# Patient Record
Sex: Female | Born: 1970 | Race: White | Hispanic: No | Marital: Single | Smoking: Never smoker
Health system: Southern US, Community
[De-identification: ages and names within clinical notes are randomized; demographics above are authoritative.]

## PROBLEM LIST (undated history)

## (undated) DIAGNOSIS — G40909 Epilepsy, unspecified, not intractable, without status epilepticus: Secondary | ICD-10-CM

## (undated) DIAGNOSIS — R011 Cardiac murmur, unspecified: Secondary | ICD-10-CM

## (undated) HISTORY — PX: CYSTECTOMY: SUR359

---

## 2004-03-31 ENCOUNTER — Emergency Department (HOSPITAL_COMMUNITY): Admission: EM | Admit: 2004-03-31 | Discharge: 2004-03-31 | Payer: Self-pay | Admitting: Emergency Medicine

## 2012-09-14 ENCOUNTER — Other Ambulatory Visit: Payer: Self-pay | Admitting: Obstetrics and Gynecology

## 2012-09-14 DIAGNOSIS — R928 Other abnormal and inconclusive findings on diagnostic imaging of breast: Secondary | ICD-10-CM

## 2012-09-20 ENCOUNTER — Other Ambulatory Visit: Payer: Self-pay

## 2012-10-16 ENCOUNTER — Ambulatory Visit
Admission: RE | Admit: 2012-10-16 | Discharge: 2012-10-16 | Disposition: A | Discharge status: Home / Self Care | Payer: No Typology Code available for payment source | Source: Ambulatory Visit | Attending: Obstetrics and Gynecology | Admitting: Obstetrics and Gynecology

## 2012-10-16 DIAGNOSIS — R928 Other abnormal and inconclusive findings on diagnostic imaging of breast: Secondary | ICD-10-CM

## 2013-07-20 ENCOUNTER — Emergency Department (HOSPITAL_COMMUNITY)
Admission: EM | Admit: 2013-07-20 | Discharge: 2013-07-20 | Disposition: A | Discharge status: Home / Self Care | Payer: Self-pay | Attending: Emergency Medicine | Admitting: Emergency Medicine

## 2013-07-20 ENCOUNTER — Encounter (HOSPITAL_COMMUNITY): Payer: Self-pay | Admitting: Emergency Medicine

## 2013-07-20 DIAGNOSIS — R011 Cardiac murmur, unspecified: Secondary | ICD-10-CM | POA: Insufficient documentation

## 2013-07-20 DIAGNOSIS — Z8669 Personal history of other diseases of the nervous system and sense organs: Secondary | ICD-10-CM | POA: Insufficient documentation

## 2013-07-20 DIAGNOSIS — R110 Nausea: Secondary | ICD-10-CM | POA: Insufficient documentation

## 2013-07-20 DIAGNOSIS — R21 Rash and other nonspecific skin eruption: Secondary | ICD-10-CM | POA: Insufficient documentation

## 2013-07-20 DIAGNOSIS — Z880 Allergy status to penicillin: Secondary | ICD-10-CM | POA: Insufficient documentation

## 2013-07-20 DIAGNOSIS — Z79899 Other long term (current) drug therapy: Secondary | ICD-10-CM | POA: Insufficient documentation

## 2013-07-20 DIAGNOSIS — R259 Unspecified abnormal involuntary movements: Secondary | ICD-10-CM

## 2013-07-20 HISTORY — DX: Cardiac murmur, unspecified: R01.1

## 2013-07-20 HISTORY — DX: Epilepsy, unspecified, not intractable, without status epilepticus: G40.909

## 2013-07-20 LAB — COMPREHENSIVE METABOLIC PANEL
ALK PHOS: 51 U/L (ref 39–117)
ALT: 27 U/L (ref 0–35)
AST: 22 U/L (ref 0–37)
Albumin: 3.4 g/dL — ABNORMAL LOW (ref 3.5–5.2)
BUN: 27 mg/dL — ABNORMAL HIGH (ref 6–23)
CO2: 20 mEq/L (ref 19–32)
Calcium: 9.4 mg/dL (ref 8.4–10.5)
Chloride: 107 mEq/L (ref 96–112)
Creatinine, Ser: 0.66 mg/dL (ref 0.50–1.10)
GFR calc Af Amer: >90 mL/min (ref >90)
GFR calc non Af Amer: >90 mL/min (ref >90)
GLUCOSE: 107 mg/dL — AB (ref 70–99)
POTASSIUM: 4.1 meq/L (ref 3.7–5.3)
Sodium: 142 mEq/L (ref 137–147)
Total Protein: 7 g/dL (ref 6.0–8.3)

## 2013-07-20 LAB — CBC WITH DIFFERENTIAL/PLATELET
Basophils Absolute: 0 10*3/uL (ref 0.0–0.1)
Basophils Relative: 0 % (ref 0–1)
EOS ABS: 0.1 10*3/uL (ref 0.0–0.7)
Eosinophils Relative: 1 % (ref 0–5)
HEMATOCRIT: 37.7 % (ref 36.0–46.0)
HEMOGLOBIN: 12.7 g/dL (ref 12.0–15.0)
LYMPHS ABS: 2.3 10*3/uL (ref 0.7–4.0)
Lymphocytes Relative: 34 % (ref 12–46)
MCH: 27.9 pg (ref 26.0–34.0)
MCHC: 33.7 g/dL (ref 30.0–36.0)
MCV: 82.9 fL (ref 78.0–100.0)
MONO ABS: 0.7 10*3/uL (ref 0.1–1.0)
MONOS PCT: 11 % (ref 3–12)
NEUTROS PCT: 54 % (ref 43–77)
Neutro Abs: 3.7 10*3/uL (ref 1.7–7.7)
Platelets: 249 10*3/uL (ref 150–400)
RBC: 4.55 MIL/uL (ref 3.87–5.11)
RDW: 12.6 % (ref 11.5–15.5)
WBC: 6.8 10*3/uL (ref 4.0–10.5)

## 2013-07-20 LAB — URINALYSIS, ROUTINE W REFLEX MICROSCOPIC
Bilirubin Urine: NEGATIVE
Glucose, UA: NEGATIVE mg/dL
Hgb urine dipstick: NEGATIVE
Ketones, ur: NEGATIVE mg/dL
LEUKOCYTES UA: NEGATIVE
Nitrite: NEGATIVE
PROTEIN: NEGATIVE mg/dL
Specific Gravity, Urine: 1.021 (ref 1.005–1.030)
UROBILINOGEN UA: 0.2 mg/dL (ref 0.0–1.0)
pH: 5.5 (ref 5.0–8.0)

## 2013-07-20 MED ORDER — LORAZEPAM 0.5 MG PO TABS: 0.5000 mg | ORAL_TABLET | Freq: Two times a day (BID) | ORAL | Status: DC

## 2013-07-20 MED ORDER — LORAZEPAM 1 MG PO TABS
1.0000 mg | ORAL_TABLET | Freq: Once | ORAL | Status: AC
  Administered 2013-07-20: 0.5 mg via ORAL
  Filled 2013-07-20: qty 1

## 2013-07-20 NOTE — Consult Note (Addendum)
Neurology Consultation Reason for Consult: Shaking Referring Physician: Nonda Louocherty, M  CC: Shaking  History is obtained from: Patient, mother  HPI: Janice Mccoy is a 43 y.o. female with a history of childhood epilepsy who presents with 3 days of truncal shaking. She states that it has been progressively worse over the past 3 days. She has not noticed anything that makes it better, but has noticed that stress or being upset makes it worse. It happens every few seconds to a few minutes and consists of bilateral truncal agitation. She has never had anything like this before.  She does note that she had a migraine 3 days ago, but the headache is completely gone. She denies any underlying stressors that are doing worse.   ROS: A 14 point ROS was performed and is negative except as noted in the HPI.  Past Medical History  Diagnosis Date  . Epilepsy     childhood  . Heart murmur     Family History: No history of movement disorders  Social History: Tob: Denies  Exam: Current vital signs: BP 134/68  Pulse 99  Temp(Src) 97.8 F (36.6 C) (Oral)  Resp 19  SpO2 95%  LMP 07/14/2013 Vital signs in last 24 hours: Temp:  [97.8 F (36.6 C)] 97.8 F (36.6 C) (04/03 1720) Pulse Rate:  [99] 99 (04/03 1720) Resp:  [19] 19 (04/03 1720) BP: (134)/(68) 134/68 mmHg (04/03 1720) SpO2:  [95 %] 95 % (04/03 1720)  General: In bed, NAD CV: Regular rate and rhythm Mental Status: Patient is awake, alert, oriented to person, place, month, year, and situation. Immediate and remote memory are intact. Patient is able to give a clear and coherent history. No signs of aphasia or neglect Cranial Nerves: II: Visual Fields are full. Pupils are equal, round, and reactive to light.  Discs are difficult to visualize. III,IV, VI: EOMI without ptosis or diploplia.  V: Facial sensation is symmetric to temperature VII: Facial movement is symmetric.  VIII: hearing is intact to voice X: Uvula elevates  symmetrically XI: Shoulder shrug is symmetric. XII: tongue is midline without atrophy or fasciculations.  Motor: Tone is normal. Bulk is normal. 5/5 strength was present in all four extremities.  Sensory: Sensation is symmetric to light touch and temperature in the arms and legs. Deep Tendon Reflexes: Checking her deep tendon reflexes repeatedly precipitates her abnormal movements, and sometimes she will take her leg prior to the reflex hammer actually touching her tendon. Plantars: Toes are downgoing bilaterally.  Cerebellar: She has no ataxia, and indeed the movements are completely distractible when she is performing finger-nose-finger. Gait: Patient has a stable casual gait. Able to tandem. She has bilateral shoulder "wiggling" the entire time that she is walking.    I have reviewed labs in epic and the results pertinent to this consultation are: CMP, CBC-unremarkable  Impression: 43 year old female with abnormal movements of the shoulders and trunk sometimes including the legs. The nature of these movements, and the bilateral nature are not consistent with seizures. The distractible nature is highly suggestive of a psychogenic etiology. At this time, I would not favor any further workup.   Recommendations: 1) No further inpatient workup at this time 2) could consider anxiolytic for a few days. 3) If symptoms are not improving or patient would like second opinion, could be referred to outpatient neurology.   Ritta SlotMcNeill Romel Dumond, MD [facility] [phone]  If 7pm- 7am, please page neurology on call as listed in [facility].

## 2013-07-20 NOTE — ED Notes (Addendum)
Pt states that since Wednesday she has had intermittent bouts of full body shaking. Pt remains awake during these episodes. States she has a hx of childhood epilepsy and her last peti-mal seizure was when she was 43yo. Also experiencing occasional chest pain. Alert and oriented at this time.

## 2013-07-20 NOTE — Discharge Instructions (Signed)
Tremor  Tremor is a rhythmic, involuntary muscular contraction characterized by oscillations (to-and-fro movements) of a part of the body. The most common of all involuntary movements, tremor can affect various body parts such as the hands, head, facial structures, vocal cords, trunk, and legs; most tremors, however, occur in the hands. Tremor often accompanies neurological disorders associated with aging. Although the disorder is not life-threatening, it can be responsible for functional disability and social embarrassment.  TREATMENT   There are many types of tremor and several ways in which tremor is classified. The most common classification is by behavioral context or position. There are five categories of tremor within this classification: resting, postural, kinetic, task-specific, and psychogenic. Resting or static tremor occurs when the muscle is at rest, for example when the hands are lying on the lap. This type of tremor is often seen in patients with Parkinson's disease. Postural tremor occurs when a patient attempts to maintain posture, such as holding the hands outstretched. Postural tremors include physiological tremor, essential tremor, tremor with basal ganglia disease (also seen in patients with Parkinson's disease), cerebellar postural tremor, tremor with peripheral neuropathy, post-traumatic tremor, and alcoholic tremor. Kinetic or intention (action) tremor occurs during purposeful movement, for example during finger-to-nose testing. Task-specific tremor appears when performing goal-oriented tasks such as handwriting, speaking, or standing. This group consists of primary writing tremor, vocal tremor, and orthostatic tremor. Psychogenic tremor occurs in both older and younger patients. The key feature of this tremor is that it dramatically lessens or disappears when the patient is distracted.  PROGNOSIS  There are some treatment options available for tremor; the appropriate treatment depends on  accurate diagnosis of the cause. Some tremors respond to treatment of the underlying condition, for example in some cases of psychogenic tremor treating the patient's underlying mental problem may cause the tremor to disappear. Also, patients with tremor due to Parkinson's disease may be treated with Levodopa drug therapy. Symptomatic drug therapy is available for several other tremors as well. For those cases of tremor in which there is no effective drug treatment, physical measures such as teaching the patient to brace the affected limb during the tremor are sometimes useful. Surgical intervention such as thalamotomy or deep brain stimulation may be useful in certain cases.  Document Released: 03/26/2002 Document Revised: 06/28/2011 Document Reviewed: 04/05/2005  ExitCare® Patient Information ©2014 [employer].

## 2013-07-20 NOTE — Progress Notes (Signed)
   CARE MANAGEMENT ED NOTE 07/20/2013  Patient:  Janice Mccoy,Janice Mccoy   Account Number:  [id number]  Date Initiated:  07/20/2013  Documentation initiated by:  Radford PaxFERRERO,Jariah Tarkowski  Subjective/Objective Assessment:   Patient presents to Ed with bouts of full body shaking     Subjective/Objective Assessment Detail:   Patient with pmhx of childhood epilepsy.     Action/Plan:   Action/Plan Detail:   Anticipated DC Date:       Status Recommendation to Physician:   Result of Recommendation:    Other ED Services  Consult Working Plan    DC Planning Services  Other  PCP issues    Choice offered to / List presented to:            Status of service:  Completed, signed off  ED Comments:   ED Comments Detail:  EDCM spoke to patient at bedside.  Patient confirms her pcp [facility]Wilson [name]  system updated.  Patient reports she does not have insurance.  As per patient, she has been denied by Medicaid.  [facility] provided [facility] with list of discounted pharmacies and websites needymeds.org and goodrx.com for medication assistance, list of financial resources in the community such as local churches and [employer]. Patient reports she is not taking any medications at this time except for one and [facility].  [facility].  Patient thankful for resources.  No further EDCM needs at this time.

## 2013-07-20 NOTE — ED Provider Notes (Signed)
CSN: [id number]     Arrival date & time 07/20/13  1653 History   First MD Initiated Contact with Patient 07/20/13 1721     Chief Complaint  Patient presents with  . Shaking     (Consider location/radiation/quality/duration/timing/severity/associated sxs/prior Treatment) Patient is a 43 y.o. female presenting with neurologic complaint. The history is provided by the patient. No language interpreter was used.  Neurologic Problem This is a new problem. The current episode started in the past 7 days. The problem occurs intermittently. The problem has been waxing and waning. Associated symptoms include nausea. Pertinent negatives include no fever, headaches, urinary symptoms, visual change or weakness.    Past Medical History  Diagnosis Date  . Epilepsy     childhood  . Heart murmur    No past surgical history on file. No family history on file. History  Substance Use Topics  . Smoking status: Never Smoker   . Smokeless tobacco: Not on file  . Alcohol Use: No   OB History   Grav Para Term Preterm Abortions TAB SAB Ect Mult Living                 Review of Systems  Constitutional: Negative for fever.  Gastrointestinal: Positive for nausea.  Neurological: Positive for tremors. Negative for facial asymmetry, speech difficulty, weakness and headaches.  All other systems reviewed and are negative.      Allergies  Amoxicillin  Home Medications   Current Outpatient Rx  Name  Route  Sig  Dispense  Refill  . Multiple Vitamin (MULTIVITAMIN WITH MINERALS) TABS tablet   Oral   Take 1 tablet by mouth daily.         Marland Kitchen omega-3 acid ethyl esters (LOVAZA) 1 G capsule   Oral   Take 1 g by mouth daily.         . propranolol (INDERAL) 80 MG tablet   Oral   Take 40 mg by mouth 2 (two) times daily.         . vitamin C (ASCORBIC ACID) 500 MG tablet   Oral   Take 500 mg by mouth daily.          BP 134/68  Pulse 99  Temp(Src) 97.8 F (36.6 C) (Oral)  Resp 19  SpO2  95%  LMP 07/14/2013 Physical Exam  Nursing note and vitals reviewed. Constitutional: She is oriented to person, place, and time. She appears well-developed and well-nourished.  HENT:  Head: Normocephalic and atraumatic.  Eyes: Conjunctivae are normal. Pupils are equal, round, and reactive to light.  Neck: Normal range of motion.  Cardiovascular: Normal rate and regular rhythm.   Pulmonary/Chest: Effort normal and breath sounds normal.  Abdominal: Soft. Bowel sounds are normal.  Musculoskeletal: Normal range of motion. She exhibits no edema and no tenderness.  Lymphadenopathy:    She has no cervical adenopathy.  Neurological: She is alert and oriented to person, place, and time. She has normal strength. No cranial nerve deficit or sensory deficit. Coordination normal. GCS eye subscore is 4. GCS verbal subscore is 5. GCS motor subscore is 6.  Skin: Skin is warm and dry. Rash noted. There is erythema.  Erythematous dry skin rash on face.  Psychiatric: She has a normal mood and affect. Her behavior is normal. Judgment and thought content normal.    ED Course  Procedures (including critical care time) Labs Review Labs Reviewed  CBC WITH DIFFERENTIAL  COMPREHENSIVE METABOLIC PANEL  URINALYSIS, ROUTINE W REFLEX MICROSCOPIC   Imaging  Review No results found.   EKG Interpretation   Date/Time:  Friday July 20 2013 17:27:15 EDT Ventricular Rate:  103 PR Interval:  153 QRS Duration: 76 QT Interval:  328 QTC Calculation: 429 R Axis:   71 Text Interpretation:  Sinus tachycardia Probable left atrial enlargement  Confirmed by WARD,  DO, KRISTEN ([id number]) on 07/20/2013 5:29:16 PM     Patient seen by neurology.  Feels origin is psychogenic.  Suggests short term anxiolytic for symptom control.  Information for out-patient neurology follow-up provided. MDM   Final diagnoses:  None    Tremor, likely psychogenic.    Jimmye Normanavid John Itza Maniaci, NP 07/20/13 2154

## 2013-07-21 NOTE — ED Provider Notes (Signed)
Medical screening examination/treatment/procedure(s) were performed by non-physician practitioner and as supervising physician I was immediately available for consultation/collaboration.   Shanna CiscoMegan E Docherty, MD 07/21/13 1038

## 2013-08-29 ENCOUNTER — Encounter: Payer: Self-pay | Admitting: *Deleted

## 2013-08-30 ENCOUNTER — Ambulatory Visit (INDEPENDENT_AMBULATORY_CARE_PROVIDER_SITE_OTHER): Payer: Self-pay | Admitting: Neurology

## 2013-08-30 ENCOUNTER — Encounter: Payer: Self-pay | Admitting: Neurology

## 2013-08-30 ENCOUNTER — Encounter (INDEPENDENT_AMBULATORY_CARE_PROVIDER_SITE_OTHER): Payer: Self-pay

## 2013-08-30 VITALS — BP 112/67 | HR 90 | Ht 63.0 in | Wt 185.0 lb

## 2013-08-30 DIAGNOSIS — F411 Generalized anxiety disorder: Secondary | ICD-10-CM

## 2013-08-30 DIAGNOSIS — R259 Unspecified abnormal involuntary movements: Secondary | ICD-10-CM

## 2013-08-30 DIAGNOSIS — F419 Anxiety disorder, unspecified: Secondary | ICD-10-CM

## 2013-08-30 MED ORDER — CLONAZEPAM 0.5 MG PO TABS: 0.5000 mg | ORAL_TABLET | Freq: Two times a day (BID) | ORAL | Status: DC | PRN

## 2013-08-30 NOTE — Patient Instructions (Signed)
Overall you are doing fairly well but I do want to suggest a few things today:   Remember to drink plenty of fluid, eat healthy meals and do not skip any meals. Try to eat protein with a every meal and eat a healthy snack such as fruit or nuts in between meals. Try to keep a regular sleep-wake schedule and try to exercise daily, particularly in the form of walking, 20-30 minutes a day, if you can.   As far as your medications are concerned, I would like to suggest the following: 1)Please stop the ativan 2)Start taking Klonopin 0.5mg  twice a day  Please have some blood work checked today  Please consider working with a psychiatrist to address possible anxiety or depression  I would like to see you back in 1 month, sooner if we need to. Please call us with any interim questions, concerns, problems, updates or refill requests.   Please also call us for any test results so we can go over those with you on the phone.  My clinical assistant and will answer any of your questions and relay your messages to me and also relay most of my messages to you.   Our phone number is [phone]. We also have an after hours call service for urgent matters and there is a physician on-call for urgent questions. For any emergencies you know to call 911 or go to the nearest emergency room

## 2013-08-30 NOTE — Progress Notes (Signed)
[facility]    Provider:  Dr Hosie PoissonSumner Referring Provider: Kaleen MaskElkins, Wilson Mccoy, * Primary Care Physician:  Janice MaskELKINS,[name], MD  CC:  Abnormal involuntary movements  HPI:  Janice Mccoy is a 43 y.o. female here as a referral from Dr. Jeannetta Mccoy for abnormal involuntary movements  Started around 1-2 months ago, initially noted symptoms in her legs, started in both legs at the same time and then spread up to involve the rest of her body. Came on acutely, states she woke up with the symptoms. Describes symptoms as being constant but will fluctuate throughout the day. Is almost resolved by the time she goes to bed. It gets worse with increased stress/anxiety. The initial symptoms were "bouncing up and down" and then went to "swaying from side to side", it will vary throughout the day. Continues to have symptoms with walking. Symptoms resolve when she is sleeping. Was evaluated by the ED, was also evaluated by a neurologist in the ED, told her it was related to anxiety, was given ativan and discharged.   Had history of childhood epilepsy described as partial seizure, last seizure episode was around 1714-43 years of age.   Around the time of symptom onset there were no new medications, no recent illnesses, no fevers.   Review of Systems: Out of a complete 14 system review, the patient complains of only the following symptoms, and all other reviewed systems are negative. + weakness, tremor, decreased energy, aching muscles, increased thirst, feeling hot  History   Social History  . Marital Status: Single    Spouse Name: N/A    Number of Children: N/A  . Years of Education: N/A   Occupational History  . Not on file.   Social History Main Topics  . Smoking status: Never Smoker   . Smokeless tobacco: Never Used  . Alcohol Use: No  . Drug Use: No  . Sexual Activity: Not on file   Other Topics Concern  . Not on file   Social History Narrative   Single, no  children   Right handed    12 th grade   1/2-1 cup daily    No family history on file.  Past Medical History  Diagnosis Date  . Epilepsy     childhood  . Heart murmur     No past surgical history on file.  Current Outpatient Prescriptions  Medication Sig Dispense Refill  . LORazepam (ATIVAN) 0.5 MG tablet Take 1 tablet (0.5 mg total) by mouth 2 (two) times daily.  8 tablet  0  . minocycline (DYNACIN) 100 MG tablet Take 100 mg by mouth 2 (two) times daily.      . Multiple Vitamin (MULTIVITAMIN WITH MINERALS) TABS tablet Take 1 tablet by mouth daily.      Marland Kitchen. omega-3 acid ethyl esters (LOVAZA) 1 G capsule Take 1 g by mouth daily.      . propranolol (INDERAL) 80 MG tablet Take 40 mg by mouth 2 (two) times daily.      . vitamin C (ASCORBIC ACID) 500 MG tablet Take 500 mg by mouth daily.       No current facility-administered medications for this visit.    Allergies as of 08/30/2013 - Review Complete 08/30/2013  Allergen Reaction Noted  . Amoxicillin Rash 07/20/2013    Vitals: BP 112/67  Pulse 90  Ht 5\' 3"  (1.6 m)  Wt 185 lb (83.915 kg)  BMI 32.78 kg/m2 Last Weight:  Wt Readings from Last 1 Encounters:  08/30/13  185 lb (83.915 kg)   Last Height:   Ht Readings from Last 1 Encounters:  08/30/13 5\' 3"  (1.6 m)     Physical exam: Exam: Gen: NAD, conversant Eyes: anicteric sclerae, moist conjunctivae HENT: Atraumatic, oropharynx clear Neck: Trachea midline; supple,  Lungs: CTA, no wheezing, rales, rhonic                          CV: RRR, no MRG Abdomen: Soft, non-tender;  Extremities: No peripheral edema  Skin: Normal temperature, no rash,  Psych: Appropriate affect, pleasant  Neuro: MS: AA&Ox3, appropriately interactive, normal affect   Speech: fluent w/o paraphasic error  Memory: good recent and remote recall  CN: PERRL, EOMI no nystagmus, no ptosis, sensation intact to LT V1-V3 bilat, face symmetric, no weakness, hearing grossly intact, palate elevates  symmetrically, shoulder shrug 5/5 bilat,  tongue protrudes midline, no fasiculations noted.  Motor: normal bulk and tone Strength: 5/5  In all extremities Near continuous movements during the exam, would alternate between a "bouncing" up and down movement and a swaying side to side movements. Decreased with distraction and was not present when I initially walked into the room  Reflexes: symmetrical, bilat downgoing toes  Sens: LT intact in all extremities  Gait: slightly wide based, bobbing motion while walking, able to tandem walk with exaggerated instability, always able to self correct when coming close to falling   Assessment:  After physical and neurologic examination, review of laboratory studies, imaging, neurophysiology testing and pre-existing records, assessment will be reviewed on the problem list.  Plan:  Treatment plan and additional workup will be reviewed under Problem List.  1)Abnormal involuntary movements 2)Anxiety  43y/o woman presenting for initial evaluation of acute onset of abnormal involuntary movements. 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 Will check TSH, ammonia, ceruloplasmin but have low suspicion for an organic cause of her movements. Counseled patient on likely diagnosis and explained how stress/anxiety can manifest in multiple different ways. Counseled her that she would benefit from working with a psychiatrist, she wishes to think about it at this time. Will d/c ativan and switch to klonopin 0.5mg  BID for treatment of anxiety and movements.   Elspeth ChoPeter Leonetta Mcgivern, DO  [facility] [address] [place], [place] [id number]  Phone [phone] Fax [phone]

## 2013-08-31 ENCOUNTER — Telehealth: Payer: Self-pay | Admitting: *Deleted

## 2013-08-31 ENCOUNTER — Other Ambulatory Visit: Payer: Self-pay | Admitting: Neurology

## 2013-08-31 DIAGNOSIS — E0590 Thyrotoxicosis, unspecified without thyrotoxic crisis or storm: Secondary | ICD-10-CM

## 2013-08-31 LAB — TSH: TSH: 0.005 u[IU]/mL — ABNORMAL LOW (ref 0.450–4.500)

## 2013-08-31 LAB — CERULOPLASMIN: Ceruloplasmin: 31.5 mg/dL (ref 16.0–45.0)

## 2013-08-31 LAB — AMMONIA: Ammonia: 47 ug/dL (ref 19–87)

## 2013-08-31 NOTE — Telephone Encounter (Signed)
Patient calling back to inform me that she will be in on Monday before 4 pm to have her labs drawn.

## 2013-09-03 ENCOUNTER — Other Ambulatory Visit (INDEPENDENT_AMBULATORY_CARE_PROVIDER_SITE_OTHER): Payer: Self-pay

## 2013-09-03 DIAGNOSIS — Z0289 Encounter for other administrative examinations: Secondary | ICD-10-CM

## 2013-09-03 DIAGNOSIS — E0590 Thyrotoxicosis, unspecified without thyrotoxic crisis or storm: Secondary | ICD-10-CM

## 2013-09-04 ENCOUNTER — Telehealth: Payer: Self-pay | Admitting: *Deleted

## 2013-09-04 LAB — THYROID PEROXIDASE ANTIBODY: Thyroid Peroxidase Ab: 244 IU/mL — ABNORMAL HIGH (ref 0–34)

## 2013-09-04 LAB — T4, FREE: Free T4: 4.44 ng/dL — ABNORMAL HIGH (ref 0.82–1.77)

## 2013-09-04 LAB — T3, FREE: T3, Free: 13.5 pg/mL — ABNORMAL HIGH (ref 2.0–4.4)

## 2013-09-04 NOTE — Progress Notes (Signed)
Quick Note:  Patient returned my call, she is scheduled for 09/14/13 at 8 am. ______

## 2013-09-04 NOTE — Telephone Encounter (Deleted)
Called patient to schedule appointment per Dr. Sumner, left message to return the call. 

## 2013-09-04 NOTE — Telephone Encounter (Signed)
Patient returning call.

## 2013-09-04 NOTE — Telephone Encounter (Signed)
Called patient to schedule appointment per Dr. Hosie PoissonSumner, left message to return the call.

## 2013-09-04 NOTE — Telephone Encounter (Signed)
Spoke with patient, appointment is scheduled for 09/14/13 at 8 am.

## 2013-09-14 ENCOUNTER — Encounter: Payer: Self-pay | Admitting: Neurology

## 2013-09-14 ENCOUNTER — Ambulatory Visit (INDEPENDENT_AMBULATORY_CARE_PROVIDER_SITE_OTHER): Payer: Self-pay | Admitting: Neurology

## 2013-09-14 VITALS — BP 125/73 | HR 91 | Ht 60.5 in | Wt 186.0 lb

## 2013-09-14 DIAGNOSIS — Z8639 Personal history of other endocrine, nutritional and metabolic disease: Secondary | ICD-10-CM | POA: Insufficient documentation

## 2013-09-14 DIAGNOSIS — E0590 Thyrotoxicosis, unspecified without thyrotoxic crisis or storm: Secondary | ICD-10-CM

## 2013-09-14 DIAGNOSIS — R259 Unspecified abnormal involuntary movements: Secondary | ICD-10-CM

## 2013-09-14 NOTE — Progress Notes (Signed)
[facility]    Provider:  Dr Hosie Poisson Referring Provider: [name], * Primary Care Physician:  [name], MD  CC:  Abnormal involuntary movements  HPI:  Janice Mccoy is a 43 y.o. female here as a follow up from Dr. [name] for abnormal involuntary movements. At last visit she had blood work completed which showed findings consistent with hyper-thyroidism. She was also started on klonopin 0.5mg  BID. Notes the movements have improved, will occur intermittently throughout the day but will have some days free of abnormal movements. She notes episodes of feeling hot and palpitations throughout the day. She states her thyroid has not been checked in the past. (Of note, I discussed case with her PCP who states she was diagnosed with hyperthyroidism in April and instructed to start methimazole, it is unclear if this was ever started).    Initial visit 08/30/2013: Started around 1-2 months ago, initially noted symptoms in her legs, started in both legs at the same time and then spread up to involve the rest of her body. Came on acutely, states she woke up with the symptoms. Describes symptoms as being constant but will fluctuate throughout the day. Is almost resolved by the time she goes to bed. It gets worse with increased stress/anxiety. The initial symptoms were "bouncing up and down" and then went to "swaying from side to side", it will vary throughout the day. Continues to have symptoms with walking. Symptoms resolve when she is sleeping. Was evaluated by the ED, was also evaluated by a neurologist in the ED, told her it was related to anxiety, was given ativan and discharged.   Had history of childhood epilepsy described as partial seizure, last seizure episode was around 42-64 years of age.   Around the time of symptom onset there were no new medications, no recent illnesses, no fevers.   Review of Systems: Out of a complete 14 system review, the  patient complains of only the following symptoms, and all other reviewed systems are negative. + weakness, tremor, decreased energy, aching muscles, increased thirst, feeling hot  History   Social History  . Marital Status: Single    Spouse Name: N/A    Number of Children: 0  . Years of Education: 12   Occupational History  . live in caregiver    Social History Main Topics  . Smoking status: Never Smoker   . Smokeless tobacco: Never Used  . Alcohol Use: No  . Drug Use: No  . Sexual Activity: Not on file   Other Topics Concern  . Not on file   Social History Narrative   Single, no children   Right handed    12 th grade   1/2-1 cup daily    No family history on file.  Past Medical History  Diagnosis Date  . Epilepsy     childhood  . Heart murmur     Past Surgical History  Procedure Laterality Date  . Cystectomy      Current Outpatient Prescriptions  Medication Sig Dispense Refill  . clonazePAM (KLONOPIN) 0.5 MG tablet Take 1 tablet (0.5 mg total) by mouth 2 (two) times daily as needed for anxiety.  60 tablet  0  . Multiple Vitamin (MULTIVITAMIN WITH MINERALS) TABS tablet Take 1 tablet by mouth daily.      Marland Kitchen omega-3 acid ethyl esters (LOVAZA) 1 G capsule Take 1 g by mouth daily.      . propranolol (INDERAL) 80 MG tablet Take 40  mg by mouth 2 (two) times daily.      . vitamin C (ASCORBIC ACID) 500 MG tablet Take 500 mg by mouth daily.       No current facility-administered medications for this visit.    Allergies as of 09/14/2013 - Review Complete 09/14/2013  Allergen Reaction Noted  . Amoxicillin Rash 07/20/2013    Vitals: BP 125/73  Pulse 91  Ht 5' 0.5" (1.537 m)  Wt 186 lb (84.369 kg)  BMI 35.71 kg/m2  LMP 09/14/2013 Last Weight:  Wt Readings from Last 1 Encounters:  09/14/13 186 lb (84.369 kg)   Last Height:   Ht Readings from Last 1 Encounters:  09/14/13 5' 0.5" (1.537 m)     Physical exam: Exam: Gen: NAD, conversant Eyes: anicteric  sclerae, moist conjunctivae HENT: Atraumatic, oropharynx clear Neck: Trachea midline; supple,  Lungs: CTA, no wheezing, rales, rhonic                          CV: RRR, no MRG Abdomen: Soft, non-tender;  Extremities: No peripheral edema  Skin: Normal temperature, no rash,  Psych: Appropriate affect, pleasant  Neuro: MS: AA&Ox3, appropriately interactive, normal affect   Speech: fluent w/o paraphasic error  Memory: good recent and remote recall  CN: PERRL, EOMI no nystagmus, no ptosis, sensation intact to LT V1-V3 bilat, face symmetric, no weakness, hearing grossly intact, palate elevates symmetrically, shoulder shrug 5/5 bilat,  tongue protrudes midline, no fasiculations noted.  Motor: normal bulk and tone Strength: 5/5  In all extremities Brief intermittent episodes of abnormal movements, predominantly of the trunk, would alternate between a "bouncing" up and down movement and a swaying side to side movements. Decreased with distraction and was not present when I initially walked into the room  Reflexes: symmetrical, bilat downgoing toes  Sens: LT intact in all extremities  Gait: slightly wide based, bobbing motion while walking, able to tandem walk with exaggerated instability, always able to self correct when coming close to falling   Assessment:  After physical and neurologic examination, review of laboratory studies, imaging, neurophysiology testing and pre-existing records, assessment will be reviewed on the problem list.  Plan:  Treatment plan and additional workup will be reviewed under Problem List.  1)Abnormal involuntary movements 2)Anxiety 3)Hyperthyroidism  43y/o woman presenting for follow up evaluation of acute onset of abnormal involuntary movements. At last visit her thyroid was checked and she was found to have findings consistent with hyperthyroidism. 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 Discussed case with her PCP, Dr Jeannetta NapElkins, will refer back to him for treatment of hyperthyroidism. Will follow up after treatment to see if any improvement in movements noted. Continue on klonopin and inderal for now.   Elspeth ChoPeter Mileidy Atkin, DO  [facility] [address] [place], [place] [id number]  Phone [phone] Fax [phone]

## 2013-09-27 ENCOUNTER — Other Ambulatory Visit: Payer: Self-pay | Admitting: Neurology

## 2013-09-28 NOTE — Telephone Encounter (Signed)
Pt's Rx was faxed to Pt's pharmacy.

## 2013-10-03 ENCOUNTER — Encounter: Payer: Self-pay | Admitting: Neurology

## 2013-10-03 ENCOUNTER — Ambulatory Visit (INDEPENDENT_AMBULATORY_CARE_PROVIDER_SITE_OTHER): Payer: Self-pay | Admitting: Neurology

## 2013-10-03 VITALS — BP 121/60 | HR 77 | Ht 60.5 in | Wt 187.0 lb

## 2013-10-03 DIAGNOSIS — R259 Unspecified abnormal involuntary movements: Secondary | ICD-10-CM

## 2013-10-03 DIAGNOSIS — E0590 Thyrotoxicosis, unspecified without thyrotoxic crisis or storm: Secondary | ICD-10-CM

## 2013-10-03 DIAGNOSIS — F411 Generalized anxiety disorder: Secondary | ICD-10-CM

## 2013-10-03 DIAGNOSIS — F419 Anxiety disorder, unspecified: Secondary | ICD-10-CM

## 2013-10-03 NOTE — Progress Notes (Signed)
[facility]    Bard Haupert:  Janice Mccoy: Janice Mccoy, Janice Mccoy, * Primary Care Physician:  Janice Mccoy,Janice OLIVER, MD  CC:  Abnormal involuntary movements  HPI:  Janice RedbirdBarbara A Mccoy is a 43 y.o. female here as a follow up from Janice. Jeannetta Mccoy for abnormal involuntary movements. At last visit she had blood work completed which showed findings consistent with hyper-thyroidism. She was instructed to follow up with her PCP for management of her hyperthyroidism. Overall doing well. Currently taking Methimazole TID for the hyperthyroidism. Notes the movements have improved, occuring less often and not as severe. Remains on klonopin and the inderal. Notes movements come out more when she is stressed or anxious. Scheduled to follow up with PCP in 6 weeks.   Initial visit 08/30/2013: Started around 1-2 months ago, initially noted symptoms in her legs, started in both legs at the same time and then spread up to involve the rest of her body. Came on acutely, states she woke up with the symptoms. Describes symptoms as being constant but will fluctuate throughout the day. Is almost resolved by the time she goes to bed. It gets worse with increased stress/anxiety. The initial symptoms were "bouncing up and down" and then went to "swaying from side to side", it will vary throughout the day. Continues to have symptoms with walking. Symptoms resolve when she is sleeping. Was evaluated by the ED, was also evaluated by a neurologist in the ED, told her it was related to anxiety, was given ativan and discharged.   Had history of childhood epilepsy described as partial seizure, last seizure episode was around 7514-43 years of age.   Around the time of symptom onset there were no new medications, no recent illnesses, no fevers.   Review of Systems: Out of a complete 14 system review, the patient complains of only the following symptoms, and all other reviewed systems are negative. +  weakness, tremor, chest tightness, rash, itching  History   Social History  . Marital Status: Single    Spouse Name: N/A    Number of Children: 0  . Years of Education: 12   Occupational History  . live in caregiver    Social History Main Topics  . Smoking status: Never Smoker   . Smokeless tobacco: Never Used  . Alcohol Use: No  . Drug Use: No  . Sexual Activity: Not on file   Other Topics Concern  . Not on file   Social History Narrative   Single, no children   Right handed    12 th grade   1/2-1 cup daily    No family history on file.  Past Medical History  Diagnosis Date  . Epilepsy     childhood  . Heart murmur     Past Surgical History  Procedure Laterality Date  . Cystectomy      Current Outpatient Prescriptions  Medication Sig Dispense Refill  . clonazePAM (KLONOPIN) 0.5 MG tablet TAKE ONE TABLET BY MOUTH TWICE DAILY AS NEEDED FOR ANXIETY  60 tablet  0  . methimazole (TAPAZOLE) 10 MG tablet Take 10 mg by mouth 3 (three) times daily.      . Multiple Vitamin (MULTIVITAMIN WITH MINERALS) TABS tablet Take 1 tablet by mouth daily.      Marland Kitchen. omega-3 acid ethyl esters (LOVAZA) 1 G capsule Take 1 g by mouth daily.      . propranolol (INDERAL) 80 MG tablet Take 40 mg by mouth 2 (two) times daily.      .Marland Kitchen  vitamin C (ASCORBIC ACID) 500 MG tablet Take 500 mg by mouth daily.       No current facility-administered medications for this visit.    Allergies as of 10/03/2013 - Review Complete 10/03/2013  Allergen Reaction Noted  . Amoxicillin Rash 07/20/2013    Vitals: BP 121/60  Pulse 77  Ht 5' 0.5" (1.537 m)  Wt 187 lb (84.823 kg)  BMI 35.91 kg/m2  LMP 09/14/2013 Last Weight:  Wt Readings from Last 1 Encounters:  10/03/13 187 lb (84.823 kg)   Last Height:   Ht Readings from Last 1 Encounters:  10/03/13 5' 0.5" (1.537 m)     Physical exam: Exam: Gen: NAD, conversant Eyes: anicteric sclerae, moist conjunctivae HENT: Atraumatic, oropharynx  clear Neck: Trachea midline; supple,  Lungs: CTA, no wheezing, rales, rhonic                          CV: RRR, no MRG Abdomen: Soft, non-tender;  Extremities: No peripheral edema Skin: Normal temperature, no rash,  Psych: Appropriate affect, pleasant  Neuro: MS: AA&Ox3, appropriately interactive, normal affect   Speech: fluent w/o paraphasic error  Memory: good recent and remote recall  CN: PERRL, EOMI no nystagmus, no ptosis, sensation intact to LT V1-V3 bilat, face symmetric, no weakness, hearing grossly intact, palate elevates symmetrically, shoulder shrug 5/5 bilat,  tongue protrudes midline, no fasiculations noted.  Motor: normal bulk and tone Strength: 5/5  In all extremities Brief fidgeting movements but otherwise no abnormal involuntary movements noted  Reflexes: symmetrical, bilat downgoing toes  Sens: LT intact in all extremities  Gait: slightly wide based, bobbing motion while walking, able to tandem walk with exaggerated instability, always able to self correct when coming close to falling   Assessment:  After physical and neurologic examination, review of laboratory studies, imaging, neurophysiology testing and pre-existing records, assessment will be reviewed on the problem list.  Plan:  Treatment plan and additional workup will be reviewed under Problem List.  1)Abnormal involuntary movements 2)Anxiety 3)Hyperthyroidism  43y/o woman presenting for follow up evaluation of acute onset of abnormal involuntary movements. Her thyroid was checked and she was found to have findings consistent with hyperthyroidism. 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 Of note, her symptoms have appeared to improve with initiation of methimazole. Will continue on current dose of Inderal and Klonopin for now. She will discuss tapering off with Janice Janice Mccoy  when she follows up with him in 6 weeks. Follow up as needed as symptoms appear to have resolved.   Janice ChoPeter Sumner, DO  [facility] [address] [place], [place] [address]  Phone [phone] Fax [phone]

## 2013-11-07 ENCOUNTER — Other Ambulatory Visit: Payer: Self-pay | Admitting: Neurology

## 2013-11-07 MED ORDER — CLONAZEPAM 0.5 MG PO TABS: ORAL_TABLET | ORAL | Status: DC

## 2013-11-07 NOTE — Telephone Encounter (Signed)
Rx has been faxed.

## 2013-11-07 NOTE — Telephone Encounter (Signed)
Patient requesting refill of Klonopin.

## 2013-11-07 NOTE — Telephone Encounter (Signed)
Last OV says she will continue same dose for now and discuss tapering the medication at OV with Dr Jeannetta NapElkins

## 2013-11-14 ENCOUNTER — Other Ambulatory Visit (HOSPITAL_COMMUNITY): Payer: Self-pay | Admitting: Family Medicine

## 2013-11-14 DIAGNOSIS — E0590 Thyrotoxicosis, unspecified without thyrotoxic crisis or storm: Secondary | ICD-10-CM

## 2013-11-28 ENCOUNTER — Encounter (HOSPITAL_COMMUNITY)
Admission: RE | Admit: 2013-11-28 | Discharge: 2013-11-28 | Disposition: A | Discharge status: Home / Self Care | Payer: BC Managed Care – PPO | Source: Ambulatory Visit | Attending: Diagnostic Radiology | Admitting: Diagnostic Radiology

## 2013-11-28 DIAGNOSIS — E0590 Thyrotoxicosis, unspecified without thyrotoxic crisis or storm: Secondary | ICD-10-CM

## 2013-11-28 DIAGNOSIS — E0500 Thyrotoxicosis with diffuse goiter without thyrotoxic crisis or storm: Secondary | ICD-10-CM | POA: Insufficient documentation

## 2013-11-29 ENCOUNTER — Encounter (HOSPITAL_COMMUNITY)
Admission: RE | Admit: 2013-11-29 | Discharge: 2013-11-29 | Disposition: A | Discharge status: Home / Self Care | Payer: BC Managed Care – PPO | Source: Ambulatory Visit | Attending: Family Medicine | Admitting: Family Medicine

## 2013-11-29 DIAGNOSIS — E0590 Thyrotoxicosis, unspecified without thyrotoxic crisis or storm: Secondary | ICD-10-CM | POA: Diagnosis present

## 2013-11-29 DIAGNOSIS — E0500 Thyrotoxicosis with diffuse goiter without thyrotoxic crisis or storm: Secondary | ICD-10-CM | POA: Diagnosis not present

## 2013-11-29 MED ORDER — SODIUM IODIDE I 131 CAPSULE
13.1000 | Freq: Once | INTRAVENOUS | Status: AC | PRN
  Administered 2013-11-29: 13.1 via ORAL

## 2013-11-29 MED ORDER — SODIUM PERTECHNETATE TC 99M INJECTION
10.8000 | Freq: Once | INTRAVENOUS | Status: AC | PRN
  Administered 2013-11-29: 11 via INTRAVENOUS

## 2013-12-13 ENCOUNTER — Encounter: Payer: Self-pay | Admitting: Internal Medicine

## 2013-12-13 ENCOUNTER — Ambulatory Visit (INDEPENDENT_AMBULATORY_CARE_PROVIDER_SITE_OTHER): Payer: BC Managed Care – PPO | Admitting: Internal Medicine

## 2013-12-13 VITALS — BP 128/60 | HR 100 | Temp 97.8°F | Ht 60.5 in | Wt 188.0 lb

## 2013-12-13 DIAGNOSIS — E0590 Thyrotoxicosis, unspecified without thyrotoxic crisis or storm: Secondary | ICD-10-CM

## 2013-12-13 LAB — T3, FREE: T3, Free: 8.8 pg/mL — ABNORMAL HIGH (ref 2.3–4.2)

## 2013-12-13 LAB — TSH: TSH: 0.02 u[IU]/mL — ABNORMAL LOW (ref 0.35–4.50)

## 2013-12-13 LAB — T4, FREE: FREE T4: 3.67 ng/dL — AB (ref 0.60–1.60)

## 2013-12-13 MED ORDER — METHIMAZOLE 10 MG PO TABS: 10.0000 mg | ORAL_TABLET | Freq: Two times a day (BID) | ORAL | Status: DC

## 2013-12-13 MED ORDER — ATENOLOL 25 MG PO TABS: 25.0000 mg | ORAL_TABLET | Freq: Two times a day (BID) | ORAL | Status: DC

## 2013-12-13 NOTE — Patient Instructions (Signed)
Please restart Methimazole 10 mg 2x a day with meals.  Please stop the Methimazole (Tapazole) and call us or your primary care doctor if you develop: - sore throat - fever - yellow skin - dark urine - light colored stools As we will then need to check your blood counts and liver tests.  Please start Atenolol 25 mg 2x a day.  Please return in 2 months.  Hyperthyroidism The thyroid is a large gland located in the lower front part of your neck. The thyroid helps control metabolism. Metabolism is how your body uses food. It controls metabolism with the hormone thyroxine. When the thyroid is overactive, it produces too much hormone. When this happens, these following problems may occur:   Nervousness  Heat intolerance  Weight loss (in spite of increase food intake)  Diarrhea  Change in hair or skin texture  Palpitations (heart skipping or having extra beats)  Tachycardia (rapid heart rate)  Loss of menstruation (amenorrhea)  Shaking of the hands CAUSES  Grave's Disease (the immune system attacks the thyroid gland). This is the most common cause.  Inflammation of the thyroid gland.  Tumor (usually benign) in the thyroid gland or elsewhere.  Excessive use of thyroid medications (both prescription and 'natural').  Excessive ingestion of Iodine. DIAGNOSIS  To prove hyperthyroidism, your caregiver may do blood tests and ultrasound tests. Sometimes the signs are hidden. It may be necessary for your caregiver to watch this illness with blood tests, either before or after diagnosis and treatment. TREATMENT Short-term treatment There are several treatments to control symptoms. Drugs called beta blockers may give some relief. Drugs that decrease hormone production will provide temporary relief in many people. These measures will usually not give permanent relief. Definitive therapy There are treatments available which can be discussed between you and your caregiver which will  permanently treat the problem. These treatments range from surgery (removal of the thyroid), to the use of radioactive iodine (destroys the thyroid by radiation), to the use of antithyroid drugs (interfere with hormone synthesis). The first two treatments are permanent and usually successful. They most often require hormone replacement therapy for life. This is because it is impossible to remove or destroy the exact amount of thyroid required to make a person euthyroid (normal). HOME CARE INSTRUCTIONS  See your caregiver if the problems you are being treated for get worse. Examples of this would be the problems listed above. SEEK MEDICAL CARE IF: Your general condition worsens. MAKE SURE YOU:   Understand these instructions.  Will watch your condition.  Will get help right away if you are not doing well or get worse. Document Released: 04/05/2005 Document Revised: 06/28/2011 Document Reviewed: 08/17/2006 [facility] Patient Information 2015 [place], [place]. This information is not intended to replace advice given to you by your health care provider. Make sure you discuss any questions you have with your health care provider.

## 2013-12-13 NOTE — Progress Notes (Signed)
Patient ID: Janice Mccoy, female   DOB: 01/01/1971, 43 y.o.   MRN: [id number]   HPI  Janice Mccoy is a 43 y.o.-year-old female, referred by her PCP, Dr. [name], for management of Graves ds.  Pt started to have abnormal involuntary movements (akathisia?) around [place] this year >> she was started on Ativan >> no effect >> Propranolol 40 mg 2x a day >> this helped with the rapid heartbeat, tremors and migraines >> PCP checked TFTs >> thyrotoxycosis >> started on MMI 10 mg 3x a day >>  a repeat set of TFTs was improved >> she was advised to stop MMI on 11/19/2013 in preparation for an Uptake and Scan. She stopped the Inderal around the same time. She is now off both meds.  I reviewed pt's thyroid tests: 11/14/2013: TSH 1.4, fT4 0.4, TPO Abs 253, ATA 41.2 Lab Results  Component Value Date   TSH 0.005* 08/30/2013   FREET4 4.44* 09/03/2013   She had an Uptake and Scan on 11/29/2013, showing Graves ds, with uniform scan and an elevated uptake (50%).  Pt denies feeling nodules in neck, hoarseness, + occasional dysphagia/no odynophagia, SOB with lying down; she c/o: - + fatigue - + excessive sweating/+ heat intolerance - + tremors - no anxiety - + palpitations - no hyperdefecation - + weight loss: 15 lbs in last year  She is mostly bothered by her involuntary movements - truncal.  Pt does have a FH of thyroid ds.: mother, MGM. No FH of thyroid cancer. No h/o radiation tx to head or neck.  No seaweed or kelp, no recent contrast studies. No steroid use. No herbal supplements.   ROS: Constitutional: see HPI Eyes: no blurry vision, no xerophthalmia ENT: +sore throat, no nodules palpated in throat, no dysphagia/odynophagia, no hoarseness; + tinnitus Cardiovascular: + CP/no SOB/+ palpitations/no leg swelling Respiratory: no cough/SOB Gastrointestinal: no N/V/D/C/+ heartburn Musculoskeletal: + muscle/joint aches Skin: + rash on face (dry skin), + hair loss Neurological:+ tremors/no  numbness/tingling/dizziness, + HA, h/o seizures Psychiatric: no depression/anxiety  Past Medical History  Diagnosis Date  . Epilepsy     childhood  . Heart murmur    Past Surgical History  Procedure Laterality Date  . Cystectomy     History   Social History  . Marital Status: Single    Spouse Name: N/A    Number of Children: 0  . Years of Education: 12   Occupational History  . live in caregiver    Social History Main Topics  . Smoking status: Never Smoker   . Smokeless tobacco: Never Used  . Alcohol Use: No  . Drug Use: No   Current Outpatient Prescriptions on File Prior to Visit  Medication Sig Dispense Refill  . clonazePAM (KLONOPIN) 0.5 MG tablet TAKE ONE TABLET BY MOUTH TWICE DAILY AS NEEDED FOR ANXIETY  60 tablet  0  . Multiple Vitamin (MULTIVITAMIN WITH MINERALS) TABS tablet Take 1 tablet by mouth daily.      Marland Kitchen omega-3 acid ethyl esters (LOVAZA) 1 G capsule Take 1 g by mouth daily.      . vitamin C (ASCORBIC ACID) 500 MG tablet Take 500 mg by mouth daily.       No current facility-administered medications on file prior to visit.   Allergies  Allergen Reactions  . Amoxicillin Rash   FH: see HPI  PE: BP 128/60  Pulse 100  Temp(Src) 97.8 F (36.6 C) (Oral)  Ht 5' 0.5" (1.537 m)  Wt 188 lb (85.276 kg)  BMI 36.10 kg/m2  SpO2 95%  LMP 11/16/2013 Wt Readings from Last 3 Encounters:  12/13/13 188 lb (85.276 kg)  10/03/13 187 lb (84.823 kg)  09/14/13 186 lb (84.369 kg)   Constitutional: overweight, in NAD, continuously moving trunk and some arm flailing Eyes: PERRLA, EOMI, no exophthalmos, no lid lag, no stare ENT: moist mucous membranes, no thyromegaly, no thyroid bruits, no cervical lymphadenopathy Cardiovascular: tachycardia, RR, No MRG Respiratory: CTA B Gastrointestinal: abdomen soft, NT, ND, BS+ Musculoskeletal: no deformities, strength intact in all 4 Skin: moist, warm, no rashes Neurological: + tremor with outstretched hands, DTR 4/4 in all  4  ASSESSMENT: 1. Graves ds.  PLAN:  1. Patient recently investigated for involuntary movements (akathisia?), found to have thyrotoxicosis - also has other assoc. signs and sxs: weight loss, heat intolerance,  palpitations, tachycardia, hyperreflexia. She was found to have Graves ds. On a recent Uptake and Scan. - I suggested that we recheck the TSH, fT3 and fT4 and also had thyroid stimulating antibodies  - I will also start back MMI (10 mg bid) and betablocker (Atenolol 25 mg bid) - we discussed about possible modalities of treatment for the above conditions, to include methimazole use, radioactive iodine ablation or surgery. Since her involuntary movements can be related to Graves ds, I suggested definitive tx, RAI Tx. We will schedule this. - I advised her to join my chart to communicate easier - RTC in 2 months, but likely sooner for repeat labs  Office Visit on 12/13/2013  Component Date Value Ref Range Status  . TSH 12/13/2013 0.02* 0.35 - 4.50 uIU/mL Final  . Free T4 12/13/2013 3.67* 0.60 - 1.60 ng/dL Final  . T3, Free 16/01/9603 8.8* 2.3 - 4.2 pg/mL Final  . TSI 12/13/2013 364* <140 % baseline Final   Active Graves ds >> see plan above.

## 2013-12-19 LAB — THYROID STIMULATING IMMUNOGLOBULIN: TSI: 364 % baseline — ABNORMAL HIGH (ref <140)

## 2013-12-20 ENCOUNTER — Other Ambulatory Visit: Payer: Self-pay | Admitting: Internal Medicine

## 2013-12-20 DIAGNOSIS — E0590 Thyrotoxicosis, unspecified without thyrotoxic crisis or storm: Secondary | ICD-10-CM

## 2013-12-27 ENCOUNTER — Encounter: Payer: Self-pay | Admitting: Internal Medicine

## 2014-01-04 ENCOUNTER — Encounter (HOSPITAL_COMMUNITY)
Admission: RE | Admit: 2014-01-04 | Discharge: 2014-01-04 | Disposition: A | Discharge status: Home / Self Care | Payer: BC Managed Care – PPO | Source: Ambulatory Visit | Attending: Internal Medicine | Admitting: Internal Medicine

## 2014-01-04 DIAGNOSIS — E0590 Thyrotoxicosis, unspecified without thyrotoxic crisis or storm: Secondary | ICD-10-CM | POA: Diagnosis present

## 2014-01-04 LAB — HCG, SERUM, QUALITATIVE: PREG SERUM: NEGATIVE

## 2014-01-04 MED ORDER — SODIUM IODIDE I 131 CAPSULE
14.5000 | Freq: Once | INTRAVENOUS | Status: AC | PRN
  Administered 2014-01-04: 14.5 via ORAL

## 2014-02-01 ENCOUNTER — Encounter: Payer: Self-pay | Admitting: Internal Medicine

## 2014-02-01 ENCOUNTER — Other Ambulatory Visit: Payer: Self-pay

## 2014-02-04 ENCOUNTER — Encounter: Payer: Self-pay | Admitting: Internal Medicine

## 2014-02-04 ENCOUNTER — Other Ambulatory Visit: Payer: Self-pay | Admitting: Internal Medicine

## 2014-02-04 DIAGNOSIS — E0590 Thyrotoxicosis, unspecified without thyrotoxic crisis or storm: Secondary | ICD-10-CM

## 2014-02-05 ENCOUNTER — Other Ambulatory Visit: Payer: Self-pay | Admitting: *Deleted

## 2014-02-05 MED ORDER — ATENOLOL 25 MG PO TABS: 25.0000 mg | ORAL_TABLET | Freq: Two times a day (BID) | ORAL | Status: DC

## 2014-02-07 ENCOUNTER — Other Ambulatory Visit (INDEPENDENT_AMBULATORY_CARE_PROVIDER_SITE_OTHER): Payer: BC Managed Care – PPO

## 2014-02-07 DIAGNOSIS — E0590 Thyrotoxicosis, unspecified without thyrotoxic crisis or storm: Secondary | ICD-10-CM

## 2014-02-08 LAB — TSH: TSH: 0.01 u[IU]/mL — ABNORMAL LOW (ref 0.35–4.50)

## 2014-02-08 LAB — T4, FREE: Free T4: 1.02 ng/dL (ref 0.60–1.60)

## 2014-02-08 LAB — T3, FREE: T3, Free: 2.5 pg/mL (ref 2.3–4.2)

## 2014-03-05 ENCOUNTER — Encounter: Payer: Self-pay | Admitting: Internal Medicine

## 2014-03-05 ENCOUNTER — Ambulatory Visit (INDEPENDENT_AMBULATORY_CARE_PROVIDER_SITE_OTHER): Payer: BC Managed Care – PPO | Admitting: Internal Medicine

## 2014-03-05 ENCOUNTER — Other Ambulatory Visit: Payer: Self-pay | Admitting: Internal Medicine

## 2014-03-05 ENCOUNTER — Other Ambulatory Visit: Payer: Self-pay | Admitting: *Deleted

## 2014-03-05 VITALS — BP 104/62 | HR 63 | Temp 97.7°F | Resp 12 | Wt 202.4 lb

## 2014-03-05 DIAGNOSIS — E0500 Thyrotoxicosis with diffuse goiter without thyrotoxic crisis or storm: Secondary | ICD-10-CM

## 2014-03-05 DIAGNOSIS — Z23 Encounter for immunization: Secondary | ICD-10-CM

## 2014-03-05 DIAGNOSIS — E890 Postprocedural hypothyroidism: Secondary | ICD-10-CM

## 2014-03-05 MED ORDER — ATENOLOL 25 MG PO TABS: 25.0000 mg | ORAL_TABLET | Freq: Every day | ORAL | Status: DC

## 2014-03-05 NOTE — Patient Instructions (Signed)
[facility]. Please come back for a follow-up appointment in 4 months.

## 2014-03-05 NOTE — Progress Notes (Signed)
Patient ID: Janice Mccoy, female   DOB: [birth year], 43 y.o.   MRN: [id number]   HPI  Janice RedbirdBarbara A Klinck is a 43 y.o.-year-old female, returning for f/u for Graves ds. Last visit ~3 mo ago.  Reviewed hx: Pt started to have abnormal involuntary movements (akathisia?) around [place] this year >> she was started on Ativan >> no effect >> Propranolol 40 mg 2x a day >> this helped with the rapid heartbeat, tremors and migraines >> PCP checked TFTs >> thyrotoxycosis >> started on MMI 10 mg 3x a day >>  a repeat set of TFTs was improved >> she was advised to stop MMI on 11/19/2013 in preparation for an Uptake and Scan. She stopped the Inderal around the same time.   I reviewed pt's thyroid tests: 11/14/2013: TSH 1.4, fT4 0.4, TPO Abs 253, ATA 41.2 Lab Results  Component Value Date   TSH 0.01* 02/07/2014   TSH 0.02* 12/13/2013   TSH 0.005* 08/30/2013   FREET4 1.02 02/07/2014   FREET4 3.67* 12/13/2013   FREET4 4.44* 09/03/2013   Office Visit on 12/13/2013  . TSI 12/13/2013 364* <140 % baseline Final   She had an Uptake and Scan on 11/29/2013, showing Graves ds, with uniform scan and an elevated uptake (50%).  She had RAI tx on 01/04/2014.   She feels much better after the RAI tx! The involuntary movements are still present but much better. When I saw her first, she was mostly bothered by her involuntary movements - truncal. - + fatigue, but improved - resolved excessive sweating/heat intolerance - no tremors - no anxiety - resolved palpitations - no hyperdefecation - She had weight loss: 15 lbs in last year >> now gained 14 lbs since last visit - + hair loss but improved - + back pain - new  ROS: Constitutional: see HPI Eyes: no blurry vision, no xerophthalmia ENT: no sore throat, no nodules palpated in throat, no dysphagia/odynophagia, no hoarseness; + tinnitus Cardiovascular: no CP/SOB/palpitations/no leg swelling Respiratory: no cough/SOB Gastrointestinal: no N/V/D/C/+  heartburn Musculoskeletal: no muscle/joint aches - only back pain Skin: + rash on face (dry skin), + hair loss Neurological: no tremors/no numbness/tingling/dizziness, no HA  I reviewed pt's medications, allergies, PMH, social hx, family hx and no changes required, except as mentioned above.  PE: BP 104/62 mmHg  Pulse 63  Temp(Src) 97.7 F (36.5 C) (Oral)  Resp 12  Wt 202 lb 6.4 oz (91.808 kg)  SpO2 98% Wt Readings from Last 3 Encounters:  03/05/14 202 lb 6.4 oz (91.808 kg)  12/13/13 188 lb (85.276 kg)  10/03/13 187 lb (84.823 kg)   Constitutional: overweight, in NAD, still moving trunk but no more arm flailing Eyes: PERRLA, EOMI, no exophthalmos, no lid lag, no stare ENT: moist mucous membranes, no thyromegaly, no thyroid bruits, no cervical lymphadenopathy Cardiovascular: tachycardia, RR, No MRG Respiratory: CTA B Gastrointestinal: abdomen soft, NT, ND, BS+ Musculoskeletal: no deformities, strength intact in all 4 Skin: moist, warm, no rashes Neurological: no tremor with outstretched hands, DTR 4/4 in all 4  ASSESSMENT: 1. Graves ds.  PLAN:  1. Patient was found to have thyrotoxicosis during investigation for involuntary movements (akathisia?). She also had clear thyrotoxic sxs: weight loss, heat intolerance,  palpitations, tachycardia, hyperreflexia. She was found to have Graves ds. on the Uptake and Scan. We startedMMI (10 mg bid) and betablocker (Atenolol 25 mg bid) and scheduled RAI ablation. - she had RAI tx 2 mo ago >> the involuntary movements greatly decreased and she feels  much better! - will recheck TSH, fT3 and fT4 today - we discussed about tx for hypothyroidism in case she develops it - if we need to start LT4, take the thyroid hormone every day, with water, >30 minutes before breakfast, separated by >4 hours from acid reflux medications, calcium, iron, multivitamins. - advised her to decrease the Atenolol to 25 mg daily x 5 days, then stop - she felt that the  beta blocker helped with her HAs >> may restart in the future but this is per PCP - RTC in 4 months, but likely sooner for repeat labs  Orders Only on 03/05/2014  Component Date Value Ref Range Status  . TSH 03/05/2014 6.180* 0.350 - 4.500 uIU/mL Final  . Free T4 03/05/2014 0.50* 0.80 - 1.80 ng/dL Final  . 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 Will start LT4 50 mcg daily and have her return for labs in 1 mo. Will likely need increase dose then.

## 2014-03-06 DIAGNOSIS — E890 Postprocedural hypothyroidism: Secondary | ICD-10-CM | POA: Insufficient documentation

## 2014-03-06 LAB — T4, FREE: FREE T4: 0.5 ng/dL — AB (ref 0.80–1.80)

## 2014-03-06 LAB — T3, FREE: T3 FREE: 1.7 pg/mL — AB (ref 2.3–4.2)

## 2014-03-06 LAB — TSH: TSH: 6.18 u[IU]/mL — AB (ref 0.350–4.500)

## 2014-03-06 MED ORDER — LEVOTHYROXINE SODIUM 50 MCG PO TABS: 50.0000 ug | ORAL_TABLET | Freq: Every day | ORAL | Status: DC

## 2014-04-02 ENCOUNTER — Other Ambulatory Visit: Payer: Self-pay | Admitting: Internal Medicine

## 2014-04-02 ENCOUNTER — Other Ambulatory Visit (INDEPENDENT_AMBULATORY_CARE_PROVIDER_SITE_OTHER): Payer: BC Managed Care – PPO

## 2014-04-02 DIAGNOSIS — E890 Postprocedural hypothyroidism: Secondary | ICD-10-CM

## 2014-04-02 LAB — TSH: TSH: 6.45 u[IU]/mL — ABNORMAL HIGH (ref 0.35–4.50)

## 2014-04-02 LAB — T4, FREE: Free T4: 0.64 ng/dL (ref 0.60–1.60)

## 2014-04-02 MED ORDER — LEVOTHYROXINE SODIUM 75 MCG PO TABS: 75.0000 ug | ORAL_TABLET | Freq: Every day | ORAL | Status: DC

## 2014-05-14 ENCOUNTER — Other Ambulatory Visit (INDEPENDENT_AMBULATORY_CARE_PROVIDER_SITE_OTHER): Payer: Self-pay

## 2014-05-14 DIAGNOSIS — E890 Postprocedural hypothyroidism: Secondary | ICD-10-CM

## 2014-05-14 LAB — T4, FREE: Free T4: 0.94 ng/dL (ref 0.60–1.60)

## 2014-05-14 LAB — TSH: TSH: 1.27 u[IU]/mL (ref 0.35–4.50)

## 2014-05-15 ENCOUNTER — Encounter: Payer: Self-pay | Admitting: Internal Medicine

## 2014-07-04 ENCOUNTER — Ambulatory Visit (INDEPENDENT_AMBULATORY_CARE_PROVIDER_SITE_OTHER): Payer: BC Managed Care – PPO | Admitting: Internal Medicine

## 2014-07-04 ENCOUNTER — Encounter: Payer: Self-pay | Admitting: Internal Medicine

## 2014-07-04 VITALS — BP 108/64 | HR 81 | Temp 97.4°F | Resp 12 | Wt 206.0 lb

## 2014-07-04 DIAGNOSIS — E0500 Thyrotoxicosis with diffuse goiter without thyrotoxic crisis or storm: Secondary | ICD-10-CM | POA: Diagnosis not present

## 2014-07-04 DIAGNOSIS — E890 Postprocedural hypothyroidism: Secondary | ICD-10-CM | POA: Diagnosis not present

## 2014-07-04 LAB — T4, FREE: Free T4: 0.91 ng/dL (ref 0.60–1.60)

## 2014-07-04 LAB — TSH: TSH: 0.6 u[IU]/mL (ref 0.35–4.50)

## 2014-07-04 NOTE — Progress Notes (Signed)
Patient ID: Janice Mccoy, female   DOB: 05-27-1970, 44 y.o.   MRN: [id number]   HPI  Janice Mccoy is a 44 y.o.-year-old female, returning for f/u for Graves ds. Last visit 4 mo ago.  Reviewed hx: Pt started to have abnormal involuntary movements (akathisia?) around Easter 2015 >> she was started on Ativan >> no effect >> Propranolol 40 mg 2x a day >> this helped with the rapid heartbeat, tremors and migraines >> PCP checked TFTs >> thyrotoxicosis >> started on MMI 10 mg 3x a day >>  a repeat set of TFTs was improved >> she was advised to stop MMI on 11/19/2013 in preparation for an Uptake and Scan. She stopped the Inderal around the same time.   She had an Uptake and Scan on 11/29/2013, showing Graves ds, with uniform scan and an elevated uptake (50%).  She had RAI tx on 01/04/2014.   She developed postablative hypothyroidism >> started LT4 50 mcg daily >> repeated TFTs >> normal 04/2014.  I reviewed pt's thyroid tests: Lab Results  Component Value Date   TSH 1.27 05/14/2014   TSH 6.45* 04/02/2014   TSH 6.180* 03/05/2014   TSH 0.01* 02/07/2014   TSH 0.02* 12/13/2013   TSH 0.005* 08/30/2013   FREET4 0.94 05/14/2014   FREET4 0.64 04/02/2014   FREET4 0.50* 03/05/2014   FREET4 1.02 02/07/2014   FREET4 3.67* 12/13/2013   FREET4 4.44* 09/03/2013  11/14/2013: TSH 1.4, fT4 0.4, TPO Abs 253, ATA 41.2  Office Visit on 12/13/2013  . TSI 12/13/2013 364* <140 % baseline Final   She takes the LT4 - 75 mcg: - fasting - with water - eats 1-2h later - takes MVI at suppertime - + TUMS at night  She c/o: - continues to have some involuntary movements - no palpitations - + fatigue, improved - resolved excessive sweating/heat intolerance - no anxiety - no hyperdefecation - She had weight loss: 15 lbs in last year >> now gained 14 lbs since last visit - + hair loss but improved  ROS: Constitutional: see HPI Eyes: no blurry vision, no xerophthalmia ENT: no sore throat, no  nodules palpated in throat, no dysphagia/odynophagia, no hoarseness Cardiovascular: no CP/+ SOB/no palpitations/no leg swelling Respiratory: no cough/+ SOB Gastrointestinal: no N/V/D/C/heartburn Musculoskeletal: no muscle/joint aches Skin: + rash on face (dry skin), + hair loss Neurological: no tremors/no numbness/tingling/dizziness, no HA  I reviewed pt's medications, allergies, PMH, social hx, family hx, and changes were documented in the history of present illness. Otherwise, unchanged from my initial visit note.  She is not seen by neurologist now as he moved away.  PE: BP 108/64 mmHg  Pulse 81  Temp(Src) 97.4 F (36.3 C) (Oral)  Resp 12  Wt 206 lb (93.441 kg)  SpO2 97% Body mass index is 39.55 kg/(m^2).  Wt Readings from Last 3 Encounters:  07/04/14 206 lb (93.441 kg)  03/05/14 202 lb 6.4 oz (91.808 kg)  12/13/13 188 lb (85.276 kg)   Constitutional: overweight, in NAD, no trunk arm flailing Eyes: PERRLA, EOMI, no exophthalmos, no lid lag, no stare ENT: moist mucous membranes, no thyromegaly, no cervical lymphadenopathy Cardiovascular: RRR, No MRG Respiratory: CTA B Gastrointestinal: abdomen soft, NT, ND, BS+ Musculoskeletal: no deformities, strength intact in all 4 Skin: moist, warm, no rashes Neurological: Mild tremor with outstretched hands, DTR 4/4 in all 4  ASSESSMENT: 1. Graves ds.  2. Postablative Hypothyroidism  PLAN:  1. And 2. Patient was found to have thyrotoxicosis during investigation for involuntary  movements (akathisia?). She also had clear thyrotoxic sxs: weight loss, heat intolerance,  palpitations, tachycardia, hyperreflexia. She was found to have Graves ds. on the Uptake and Scan. We startedMMI (10 mg bid) and betablocker (Atenolol 25 mg bid) and scheduled RAI ablation. She had this 12/2013 >> the involuntary movements greatly decreased and she feels much better! She is off Atenolol, and is now on LT4 for postablative hypothyroidism - will recheck TSH  and fT4 today - continue LT4 75 mcg daily  - needs refills when labs are back - takes the thyroid hormone correctly: every day, with water, >30 minutes before breakfast, separated by >4 hours from acid reflux medications, calcium, iron, multivitamins - RTC in 6 months, but likely sooner for repeat labs  Office Visit on 07/04/2014  Component Date Value Ref Range Status  . TSH 07/04/2014 0.60  0.35 - 4.50 uIU/mL Final  . Free T4 07/04/2014 0.91  0.60 - 1.60 ng/dL Final   Thyroid tests are great. We'll repeat them when she comes back in 6 months.

## 2014-07-04 NOTE — Patient Instructions (Signed)
Lease continue the Levothyroxine 75 mcg daily.   Take the thyroid hormone every day, with water, >30 minutes before breakfast, separated by >4 hours from acid reflux medications, calcium, iron, multivitamins.  Please stop at the lab.  Please come back for a follow-up appointment in 6 months.

## 2014-07-05 ENCOUNTER — Other Ambulatory Visit: Payer: Self-pay | Admitting: Internal Medicine

## 2015-01-06 ENCOUNTER — Encounter: Payer: Self-pay | Admitting: Internal Medicine

## 2015-01-06 ENCOUNTER — Ambulatory Visit (INDEPENDENT_AMBULATORY_CARE_PROVIDER_SITE_OTHER): Payer: BLUE CROSS/BLUE SHIELD | Admitting: Internal Medicine

## 2015-01-06 VITALS — BP 118/80 | HR 85 | Temp 97.2°F | Resp 12 | Wt 208.0 lb

## 2015-01-06 DIAGNOSIS — E890 Postprocedural hypothyroidism: Secondary | ICD-10-CM | POA: Diagnosis not present

## 2015-01-06 LAB — T4, FREE: Free T4: 0.98 ng/dL (ref 0.60–1.60)

## 2015-01-06 LAB — TSH: TSH: 1.59 u[IU]/mL (ref 0.35–4.50)

## 2015-01-06 NOTE — Patient Instructions (Signed)
Please stop at the lab.  Please continue Levothyroxine 75 mcg daily.  Take the thyroid hormone every day, with water, >30 minutes before breakfast, separated by >4 hours from acid reflux medications, calcium, iron, multivitamins.  Please return in 1 year.

## 2015-01-06 NOTE — Progress Notes (Signed)
Patient ID: Janice Mccoy, female   DOB: [birth year], 44 y.o.   MRN: [id number]   HPI  Janice Mccoy is a 44 y.o.-year-old female, returning for f/u for Graves ds. Last visit 6 mo ago.  Mother has cancer and pt is her caregiver.   Reviewed hx: Pt started to have abnormal involuntary movements (akathisia?) around Easter 2015 >> she was started on Ativan >> no effect >> Propranolol 40 mg 2x a day >> this helped with the rapid heartbeat, tremors and migraines >> PCP checked TFTs >> thyrotoxicosis >> started on MMI 10 mg 3x a day >>  a repeat set of TFTs was improved >> she was advised to stop MMI on 11/19/2013 in preparation for an Uptake and Scan. She stopped the Inderal around the same time.   She had an Uptake and Scan on 11/29/2013, showing Graves ds, with uniform scan and an elevated uptake (50%).  She had RAI tx on 01/04/2014.   She developed postablative hypothyroidism >> started LT4 50 mcg >> 75 mcg daily.  I reviewed pt's thyroid tests: Lab Results  Component Value Date   TSH 0.60 07/04/2014   TSH 1.27 05/14/2014   TSH 6.45* 04/02/2014   TSH 6.180* 03/05/2014   TSH 0.01* 02/07/2014   TSH 0.02* 12/13/2013   TSH 0.005* 08/30/2013   FREET4 0.91 07/04/2014   FREET4 0.94 05/14/2014   FREET4 0.64 04/02/2014   FREET4 0.50* 03/05/2014   FREET4 1.02 02/07/2014   FREET4 3.67* 12/13/2013   FREET4 4.44* 09/03/2013  11/14/2013: TSH 1.4, fT4 0.4, TPO Abs 253, ATA 41.2  Office Visit on 12/13/2013  . TSI 12/13/2013 364* <140 % baseline Final   She takes the LT4 - 75 mcg: - may miss it in am when having a migraine >> 1-2x a month - fasting - with water - eats 1-2h later - takes MVI at suppertime - + TUMS at night  She c/o: - continues to have some involuntary movements - no palpitations - + fatigue, improved - + excessive sweating/heat intolerance - no anxiety - no hyperdefecation - no weight loss or gain - + hair loss   ROS: Constitutional: see HPI Eyes: no  blurry vision, no xerophthalmia ENT: no sore throat, no nodules palpated in throat, no dysphagia/odynophagia, no hoarseness Cardiovascular: no CP/SOB/no palpitations/+ leg swelling Respiratory: no cough/SOB Gastrointestinal: + N/no V/D/C/+ heartburn Musculoskeletal: no muscle/joint aches Skin: + rash on face (dry skin), + hair loss Neurological: + tremors/no numbness/tingling/dizziness, no HA  I reviewed pt's medications, allergies, PMH, social hx, family hx, and changes were documented in the history of present illness. Otherwise, unchanged from my initial visit note.  PE: Pulse 85  Temp(Src) 97.2 F (36.2 C) (Oral)  Resp 12  Wt 208 lb (94.348 kg)  SpO2 95% Body mass index is 39.94 kg/(m^2).  Wt Readings from Last 3 Encounters:  01/06/15 208 lb (94.348 kg)  07/04/14 206 lb (93.441 kg)  03/05/14 202 lb 6.4 oz (91.808 kg)   Constitutional: overweight, in NAD Eyes: PERRLA, EOMI, no exophthalmos ENT: moist mucous membranes, no thyromegaly, no cervical lymphadenopathy Cardiovascular: RRR, No MRG Respiratory: CTA B Gastrointestinal: abdomen soft, NT, ND, BS+ Musculoskeletal: no deformities, strength intact in all 4 Skin: moist, warm, no rashes Neurological: no tremor with outstretched hands, DTR 4/4 in all 4  ASSESSMENT: 1. H/o Graves ds.  2. Postablative Hypothyroidism  PLAN:  1. And 2. Patient was found to have thyrotoxicosis during investigation for involuntary movements (akathisia?). She also had clear  thyrotoxic sxs: weight loss, heat intolerance,  palpitations, tachycardia, hyperreflexia. She was found to have Graves ds. on the Uptake and Scan. We started MMI (10 mg bid) and betablocker (Atenolol 25 mg bid) and scheduled RAI ablation. She had this in 12/2013 >> the involuntary movements greatly decreased and she feels much better! She is off Atenolol, and is now on LT4 for postablative hypothyroidism.  - will recheck TSH and fT4 today - takes the thyroid hormone correctly:  every day, with water, >30 minutes before breakfast, separated by >4 hours from acid reflux medications, calcium, iron, multivitamins; advised her to take LT4 later in the day if she has a migraine and has nausea in am, rather than skipping it completely - continue LT4 75 mcg daily  - needs refills when labs are back - RTC in 1 year  Office Visit on 01/06/2015  Component Date Value Ref Range Status  . Free T4 01/06/2015 0.98  0.60 - 1.60 ng/dL Final  . TSH 16/01/9603 1.59  0.35 - 4.50 uIU/mL Final   Normal TFTs >> continue same dose LT4.

## 2015-01-07 MED ORDER — LEVOTHYROXINE SODIUM 75 MCG PO TABS: 75.0000 ug | ORAL_TABLET | Freq: Every day | ORAL | Status: DC

## 2015-08-18 DIAGNOSIS — R7309 Other abnormal glucose: Secondary | ICD-10-CM | POA: Diagnosis not present

## 2015-09-17 DIAGNOSIS — F329 Major depressive disorder, single episode, unspecified: Secondary | ICD-10-CM | POA: Diagnosis not present

## 2016-01-06 ENCOUNTER — Encounter: Payer: Self-pay | Admitting: Internal Medicine

## 2016-01-06 ENCOUNTER — Ambulatory Visit (INDEPENDENT_AMBULATORY_CARE_PROVIDER_SITE_OTHER): Payer: BLUE CROSS/BLUE SHIELD | Admitting: Internal Medicine

## 2016-01-06 VITALS — BP 128/88 | HR 74 | Ht 61.0 in | Wt 211.0 lb

## 2016-01-06 DIAGNOSIS — Z23 Encounter for immunization: Secondary | ICD-10-CM

## 2016-01-06 DIAGNOSIS — E890 Postprocedural hypothyroidism: Secondary | ICD-10-CM

## 2016-01-06 DIAGNOSIS — Z8639 Personal history of other endocrine, nutritional and metabolic disease: Secondary | ICD-10-CM | POA: Diagnosis not present

## 2016-01-06 LAB — TSH: TSH: 3.28 u[IU]/mL (ref 0.35–4.50)

## 2016-01-06 LAB — T4, FREE: FREE T4: 0.72 ng/dL (ref 0.60–1.60)

## 2016-01-06 NOTE — Patient Instructions (Signed)
Please stop at the lab.  Please continue Levothyroxine 75 mcg daily.  Take the thyroid hormone every day, with water, >30 minutes before breakfast, separated by >4 hours from acid reflux medications, calcium, iron, multivitamins.  Please return in 1 year.

## 2016-01-06 NOTE — Progress Notes (Signed)
Patient ID: Janice RedbirdBarbara A Prill, female   DOB: October 16, 1970, 45 y.o.   MRN: [id number]   HPI  Janice Mccoy is a 45 y.o.-year-old female, returning for f/u for postablative hypothyroidism, after RAI tx for Graves ds. Last visit 1 year ago.  Reviewed hx: Pt started to have abnormal involuntary movements (akathisia?) around Easter 2015 >> she was started on Ativan >> no effect >> Propranolol 40 mg 2x a day >> this helped with the rapid heartbeat, tremors and migraines >> PCP checked TFTs >> thyrotoxicosis >> started on MMI 10 mg 3x a day >>  a repeat set of TFTs was improved >> she was advised to stop MMI on 11/19/2013 in preparation for an Uptake and Scan. She stopped the Inderal around the same time.   She had an Uptake and Scan on 11/29/2013, showing Graves ds, with uniform scan and an elevated uptake (50%).  She had RAI tx on 01/04/2014.   She developed postablative hypothyroidism >> started LT4 50 mcg >> 75 mcg daily.  I reviewed pt's thyroid tests: Lab Results  Component Value Date   TSH 1.59 01/06/2015   TSH 0.60 07/04/2014   TSH 1.27 05/14/2014   TSH 6.45 (H) 04/02/2014   TSH 6.180 (H) 03/05/2014   TSH 0.01 (L) 02/07/2014   TSH 0.02 (L) 12/13/2013   TSH 0.005 (L) 08/30/2013   FREET4 0.98 01/06/2015   FREET4 0.91 07/04/2014   FREET4 0.94 05/14/2014   FREET4 0.64 04/02/2014   FREET4 0.50 (L) 03/05/2014   FREET4 1.02 02/07/2014   FREET4 3.67 (H) 12/13/2013   FREET4 4.44 (H) 09/03/2013  11/14/2013: TSH 1.4, fT4 0.4, TPO Abs 253, ATA 41.2  Office Visit on 12/13/2013  . TSI 12/13/2013 364* <140 % baseline Final   She takes the LT4 - 75 mcg: - may miss it in am when having a migraine >> 2-3x a month - trying to take it later in the day if she can - fasting - with water - eats 1-2h later - takes MVI at suppertime - + TUMS at night  She c/o: - continues to have some involuntary movements, especially when anxious - no palpitations - fatigue improved - no excessive  sweating/heat intolerance - no anxiety - no hyperdefecation - no weight loss or gain - resolved hair loss  - + HAs  Mother had cancer and pt was her caregiver. She passed away in 03/2015.  ROS: Constitutional: see HPI Eyes: no blurry vision, no xerophthalmia ENT: no sore throat, no nodules palpated in throat, no dysphagia/odynophagia, no hoarseness Cardiovascular: no CP/SOB/no palpitations/+ leg swelling Respiratory: no cough/SOB Gastrointestinal: no N/V/D/C/heartburn Musculoskeletal: no muscle/joint aches Skin: + rash on face (dry skin), + hair loss Neurological: + tremors/no numbness/tingling/dizziness, no HA  I reviewed pt's medications, allergies, PMH, social hx, family hx, and changes were documented in the history of present illness. Otherwise, unchanged from my initial visit note.  PE: BP 128/88 (BP Location: Left Arm, Patient Position: Sitting)   Pulse 74   Ht 5\' 1"  (1.549 m)   Wt 211 lb (95.7 kg)   LMP 01/05/2016   SpO2 96%   BMI 39.87 kg/m  Body mass index is 39.87 kg/m.  Wt Readings from Last 3 Encounters:  01/06/16 211 lb (95.7 kg)  01/06/15 208 lb (94.3 kg)  07/04/14 206 lb (93.4 kg)   Constitutional: overweight, in NAD Eyes: PERRLA, EOMI, no exophthalmos ENT: moist mucous membranes, no thyromegaly, no cervical lymphadenopathy Cardiovascular: RRR, No MRG Respiratory: CTA B Gastrointestinal:  abdomen soft, NT, ND, BS+ Musculoskeletal: no deformities, strength intact in all 4 Skin: moist, warm, no rashes Neurological: no tremor with outstretched hands, DTR 4/4 in all 4  ASSESSMENT: 1. H/o Graves ds.  2. Postablative Hypothyroidism  PLAN:  1. And 2. Patient was found to have thyrotoxicosis during investigation for involuntary movements (akathisia?). She also had clear thyrotoxic sxs: weight loss, heat intolerance,  palpitations, tachycardia, hyperreflexia. She was found to have Graves ds. on the Uptake and Scan. We started MMI (10 mg bid) and beta  blocker (Atenolol 25 mg bid) and scheduled RAI ablation, which she had in 12/2013 >> the involuntary movements greatly decreased and she feels much better! She is off Atenolol, and is now on LT4 for postablative hypothyroidism.  - reviewed together latest TFTs >> normal 1 year ago - We discussed about taking the thyroid hormone every day, with water, >30 minutes before breakfast, separated by >4 hours from acid reflux medications, calcium, iron, multivitamins. She is taking it correctly. - continue LT4 75 mcg daily  - will recheck TSH and fT4 today - needs refills when labs are back - given flu shot today - RTC in 1 year  Component     Latest Ref Rng & Units 01/06/2016  TSH     0.35 - 4.50 uIU/mL 3.28  T4,Free(Direct)     0.60 - 1.60 ng/dL 5.40   TSH a little higher. I would like to repeat this in 3 months, and if still high, we may need to increase the dose of her levothyroxine.  Carlus Pavlov, MD PhD [facility] Endocrinology

## 2016-01-07 NOTE — Addendum Note (Signed)
Addended by: Carlus PavlovGHERGHE, Kealey Kemmer on: 01/07/2016 11:43 AM   Modules accepted: Orders

## 2016-01-08 ENCOUNTER — Other Ambulatory Visit: Payer: Self-pay | Admitting: Internal Medicine

## 2016-02-17 DIAGNOSIS — J019 Acute sinusitis, unspecified: Secondary | ICD-10-CM | POA: Diagnosis not present

## 2016-02-19 DIAGNOSIS — Z01419 Encounter for gynecological examination (general) (routine) without abnormal findings: Secondary | ICD-10-CM | POA: Diagnosis not present

## 2016-02-19 DIAGNOSIS — Z6835 Body mass index (BMI) 35.0-35.9, adult: Secondary | ICD-10-CM | POA: Diagnosis not present

## 2016-02-19 DIAGNOSIS — Z1231 Encounter for screening mammogram for malignant neoplasm of breast: Secondary | ICD-10-CM | POA: Diagnosis not present

## 2016-03-04 DIAGNOSIS — Z6835 Body mass index (BMI) 35.0-35.9, adult: Secondary | ICD-10-CM | POA: Diagnosis not present

## 2016-03-04 DIAGNOSIS — N920 Excessive and frequent menstruation with regular cycle: Secondary | ICD-10-CM | POA: Diagnosis not present

## 2016-04-08 ENCOUNTER — Encounter: Payer: Self-pay | Admitting: Internal Medicine

## 2016-04-08 ENCOUNTER — Other Ambulatory Visit (INDEPENDENT_AMBULATORY_CARE_PROVIDER_SITE_OTHER): Payer: BLUE CROSS/BLUE SHIELD

## 2016-04-08 ENCOUNTER — Ambulatory Visit (INDEPENDENT_AMBULATORY_CARE_PROVIDER_SITE_OTHER): Payer: BLUE CROSS/BLUE SHIELD | Admitting: Internal Medicine

## 2016-04-08 VITALS — BP 120/70 | HR 72 | Temp 98.0°F | Ht 61.0 in | Wt 214.2 lb

## 2016-04-08 DIAGNOSIS — Z8639 Personal history of other endocrine, nutritional and metabolic disease: Secondary | ICD-10-CM

## 2016-04-08 DIAGNOSIS — E890 Postprocedural hypothyroidism: Secondary | ICD-10-CM

## 2016-04-08 LAB — TSH: TSH: 4.68 u[IU]/mL — AB (ref 0.35–4.50)

## 2016-04-08 LAB — T4, FREE: Free T4: 0.75 ng/dL (ref 0.60–1.60)

## 2016-04-08 NOTE — Patient Instructions (Signed)
[facility]  Please continue Levothyroxine 75 mcg daily.  Take the thyroid hormone every day, with water, >30 minutes before breakfast, separated by >4 hours from acid reflux medications, calcium, iron, multivitamins.  Please return in 1 year.

## 2016-04-08 NOTE — Progress Notes (Signed)
Patient ID: Janice Mccoy, female   DOB: [birth year], 45 y.o.   MRN: [id number]   HPI  Janice Mccoy is a 45 y.o.-year-old female, returning for f/u for postablative hypothyroidism, after RAI tx for Graves ds. Last visit 3 mo ago.  Reviewed hx: Pt started to have abnormal involuntary movements (akathisia?) around Easter 2015 >> she was started on Ativan >> no effect >> Propranolol 40 mg 2x a day >> this helped with the rapid heartbeat, tremors and migraines >> PCP checked TFTs >> thyrotoxicosis >> started on MMI 10 mg 3x a day >>  a repeat set of TFTs was improved >> she was advised to stop MMI on 11/19/2013 in preparation for an Uptake and Scan. She stopped the Inderal around the same time.   She had an Uptake and Scan on 11/29/2013, showing Graves ds, with uniform scan and an elevated uptake (50%).  She had RAI tx on 01/04/2014.   She developed postablative hypothyroidism >> started LT4 50 mcg >> 75 mcg daily.  I reviewed pt's thyroid tests: Lab Results  Component Value Date   TSH 3.28 01/06/2016   TSH 1.59 01/06/2015   TSH 0.60 07/04/2014   TSH 1.27 05/14/2014   TSH 6.45 (H) 04/02/2014   TSH 6.180 (H) 03/05/2014   TSH 0.01 (L) 02/07/2014   TSH 0.02 (L) 12/13/2013   TSH 0.005 (L) 08/30/2013   FREET4 0.72 01/06/2016   FREET4 0.98 01/06/2015   FREET4 0.91 07/04/2014   FREET4 0.94 05/14/2014   FREET4 0.64 04/02/2014   FREET4 0.50 (L) 03/05/2014   FREET4 1.02 02/07/2014   FREET4 3.67 (H) 12/13/2013   FREET4 4.44 (H) 09/03/2013  11/14/2013: TSH 1.4, fT4 0.4, TPO Abs 253, ATA 41.2  Office Visit on 12/13/2013  . TSI 12/13/2013 364* <140 % baseline Final   She takes the LT4 - 75 mcg: - may miss it in am when having a migraine + N/V >> 1x a month: - fasting - with water - eats 1-2h later - + MVI at suppertime - + TUMS at night  She c/o: - continues to have some body tremors - no palpitations - no fatigue - + heat intolerance alternating with cold intolerance. -  no anxiety - no hyperdefecation - no weight loss or gain - resolved hair loss  - + HAs  She has perimenopause.  Mother had cancer and pt was her caregiver. She passed away in 03/2015.  ROS: Constitutional: see HPI Eyes: no blurry vision, no xerophthalmia ENT: no sore throat, no nodules palpated in throat, no dysphagia/odynophagia, no hoarseness Cardiovascular: no CP/SOB/no palpitations/+ leg swelling Respiratory: no cough/SOB Gastrointestinal: no N/V/D/C/heartburn Musculoskeletal: no muscle/joint aches Skin: + rash on face (dry skin), no hair loss Neurological: + tremors/no numbness/tingling/dizziness, no HA  I reviewed pt's medications, allergies, PMH, social hx, family hx, and changes were documented in the history of present illness. Otherwise, unchanged from my initial visit note. Started OCP.  PE: BP 120/70 (BP Location: Left Arm, Patient Position: Sitting, Cuff Size: Large)   Pulse 72   Temp 98 F (36.7 C) (Oral)   Ht 5\' 1"  (1.549 m)   Wt 214 lb 3.2 oz (97.2 kg)   LMP 03/22/2016 (Exact Date)   SpO2 98%   BMI 40.47 kg/m  Body mass index is 40.47 kg/m.  Wt Readings from Last 3 Encounters:  04/08/16 214 lb 3.2 oz (97.2 kg)  01/06/16 211 lb (95.7 kg)  01/06/15 208 lb (94.3 kg)   Constitutional: overweight, in  NAD Eyes: PERRLA, EOMI, no exophthalmos ENT: moist mucous membranes, no thyromegaly, no cervical lymphadenopathy Cardiovascular: RRR, No MRG Respiratory: CTA B Gastrointestinal: abdomen soft, NT, ND, BS+ Musculoskeletal: no deformities, strength intact in all 4 Skin: moist, warm, no rashes Neurological: no tremor with outstretched hands, DTR 4/4 in all 4  ASSESSMENT: 1. H/o Graves ds.  2. Postablative Hypothyroidism  PLAN:  1. And 2. Patient was found to have thyrotoxicosis during investigation for involuntary movements (akathisia?). She also had clear thyrotoxic sxs: weight loss, heat intolerance,  palpitations, tachycardia, hyperreflexia. She was  found to have Graves ds. on the Uptake and Scan. She was on MMI (10 mg bid) and beta blocker (Atenolol 25 mg bid) and eventually had RAI ablation  in 12/2013 >> the involuntary movements greatly decreased but still has some body tremors occasionally. She is now on LT4 for postablative hypothyroidism.  - reviewed together latest TFTs >> normal 3 mo ago - We discussed about taking the thyroid hormone every day, with water, >30 minutes before breakfast, separated by >4 hours from acid reflux medications, calcium, iron, multivitamins. She is taking it correctly. - continue LT4 75 mcg daily  - will recheck TSH and fT4 today - given flu shot at last visit - RTC in 1 year  Needs refills.  Component     Latest Ref Rng & Units 04/08/2016  TSH     0.35 - 4.50 uIU/mL 4.68 (H)  T4,Free(Direct)     0.60 - 1.60 ng/dL 9.600.75   Patient's TSH continues to increase, and now it is above the upper limit of normal. I would like to increase her levothyroxine to 88 g daily and have her back for labs in 1.5 months.  Carlus Pavlovristina Fleta Borgeson, MD PhD [facility]

## 2016-04-09 MED ORDER — LEVOTHYROXINE SODIUM 88 MCG PO TABS: 88.0000 ug | ORAL_TABLET | Freq: Every day | ORAL | 1 refills | Status: DC

## 2016-04-22 DIAGNOSIS — J019 Acute sinusitis, unspecified: Secondary | ICD-10-CM | POA: Diagnosis not present

## 2016-05-31 ENCOUNTER — Other Ambulatory Visit (INDEPENDENT_AMBULATORY_CARE_PROVIDER_SITE_OTHER): Payer: BLUE CROSS/BLUE SHIELD

## 2016-05-31 ENCOUNTER — Telehealth: Payer: Self-pay | Admitting: Internal Medicine

## 2016-05-31 DIAGNOSIS — E890 Postprocedural hypothyroidism: Secondary | ICD-10-CM | POA: Diagnosis not present

## 2016-05-31 LAB — T4, FREE: Free T4: 0.91 ng/dL (ref 0.60–1.60)

## 2016-05-31 LAB — TSH: TSH: 1.99 u[IU]/mL (ref 0.35–4.50)

## 2016-05-31 NOTE — Telephone Encounter (Signed)
Pt wanted us to be aware that [facility]

## 2016-06-01 ENCOUNTER — Other Ambulatory Visit: Payer: Self-pay

## 2016-06-01 NOTE — Telephone Encounter (Signed)
Changed pharmacy in patients chart.  

## 2016-07-01 ENCOUNTER — Other Ambulatory Visit: Payer: Self-pay

## 2016-07-01 MED ORDER — LEVOTHYROXINE SODIUM 88 MCG PO TABS: 88.0000 ug | ORAL_TABLET | Freq: Every day | ORAL | 1 refills | Status: DC

## 2016-09-21 ENCOUNTER — Telehealth: Payer: Self-pay | Admitting: Internal Medicine

## 2016-09-21 ENCOUNTER — Other Ambulatory Visit: Payer: Self-pay

## 2016-09-21 MED ORDER — LEVOTHYROXINE SODIUM 88 MCG PO TABS: 88.0000 ug | ORAL_TABLET | Freq: Every day | ORAL | 1 refills | Status: DC

## 2016-09-21 NOTE — Telephone Encounter (Signed)
Submitted

## 2016-09-21 NOTE — Telephone Encounter (Signed)
**  Remind patient they can make refill requests via MyChart**  Medication refill request (Name & Dosage):  levothyroxine (SYNTHROID, LEVOTHROID) 88 MCG tablet  Preferred pharmacy (Name & Address):  [facility] - [place] - [address] [address] [phone] (Phone) [phone] (Fax)     Other comments (if applicable):   Please call and advise patient once rx has been placed.

## 2016-10-07 DIAGNOSIS — E669 Obesity, unspecified: Secondary | ICD-10-CM | POA: Diagnosis not present

## 2016-10-07 DIAGNOSIS — M79669 Pain in unspecified lower leg: Secondary | ICD-10-CM | POA: Diagnosis not present

## 2016-10-07 DIAGNOSIS — Z049 Encounter for examination and observation for unspecified reason: Secondary | ICD-10-CM | POA: Diagnosis not present

## 2016-12-27 ENCOUNTER — Other Ambulatory Visit: Payer: Self-pay | Admitting: Internal Medicine

## 2016-12-27 ENCOUNTER — Telehealth: Payer: Self-pay | Admitting: Internal Medicine

## 2016-12-27 NOTE — Telephone Encounter (Signed)
MEDICATION: levothyroxine  PHARMACY:   [facility] - [place], [place] - [address] [address] [phone] (Phone) [phone] (Fax)   IS THIS A 90 DAY SUPPLY : no  IS PATIENT OUT OF MEDICATION: no  IF NOT; HOW MUCH IS LEFT: 1 week left  LAST APPOINTMENT DATE: 04/08/2016  NEXT APPOINTMENT DATE: 09/18  OTHER COMMENTS:    **Let patient know to contact pharmacy at the end of the day to make sure medication is ready. **  ** Please notify patient to allow 48-72 hours to process**  **Encourage patient to contact the pharmacy for refills or they can request refills through [facility]**

## 2016-12-28 ENCOUNTER — Other Ambulatory Visit: Payer: Self-pay

## 2016-12-28 MED ORDER — LEVOTHYROXINE SODIUM 88 MCG PO TABS: 88.0000 ug | ORAL_TABLET | Freq: Every day | ORAL | 2 refills | Status: DC

## 2016-12-28 NOTE — Telephone Encounter (Signed)
Submitted

## 2017-01-04 ENCOUNTER — Encounter: Payer: Self-pay | Admitting: Internal Medicine

## 2017-01-04 ENCOUNTER — Ambulatory Visit (INDEPENDENT_AMBULATORY_CARE_PROVIDER_SITE_OTHER): Payer: BLUE CROSS/BLUE SHIELD | Admitting: Internal Medicine

## 2017-01-04 VITALS — BP 132/88 | HR 81 | Wt 208.0 lb

## 2017-01-04 DIAGNOSIS — Z23 Encounter for immunization: Secondary | ICD-10-CM

## 2017-01-04 DIAGNOSIS — Z8639 Personal history of other endocrine, nutritional and metabolic disease: Secondary | ICD-10-CM | POA: Diagnosis not present

## 2017-01-04 DIAGNOSIS — E890 Postprocedural hypothyroidism: Secondary | ICD-10-CM | POA: Diagnosis not present

## 2017-01-04 DIAGNOSIS — R251 Tremor, unspecified: Secondary | ICD-10-CM | POA: Diagnosis not present

## 2017-01-04 LAB — TSH: TSH: 2.04 u[IU]/mL (ref 0.35–4.50)

## 2017-01-04 LAB — T4, FREE: Free T4: 0.97 ng/dL (ref 0.60–1.60)

## 2017-01-04 NOTE — Patient Instructions (Signed)
Please stop at the lab.  Please continue Levothyroxine 88 mcg daily.  Take the thyroid hormone every day, with water, at least 30 minutes before breakfast, separated by at least 4 hours from: - acid reflux medications - calcium - iron - multivitamins  Please return in 6 months.

## 2017-01-04 NOTE — Progress Notes (Addendum)
Patient ID: Janice Mccoy, female   DOB: 1971-03-20, 46 y.o.   MRN: [id number]   HPI  Janice Mccoy is a 46 y.o.-year-old female, returning for f/u for postablative hypothyroidism, after RAI tx for Graves ds. Last visit 9 mo ago.  Reviewed and addended hx: Pt started to have abnormal involuntary movements (akathisia?) around Easter 2015 >> she was started on Ativan >> no effect >> Propranolol 40 mg 2x a day >> this helped with the rapid heartbeat, tremors and migraines >> PCP checked TFTs >> thyrotoxicosis >> started on MMI 10 mg 3x a day >>  a repeat set of TFTs was improved >> she was advised to stop MMI on 11/19/2013 in preparation for an Uptake and Scan. She stopped the Inderal around the same time.   She had an Uptake and Scan on 11/29/2013, showing Graves ds, with uniform scan and an elevated uptake (50%).  She had RAI tx on 01/04/2014.   She developed postablative hypothyroidism >> started LT4 50 mcg >> 75 mcg daily >> dose increased to 88 mcg daily   I reviewed pt's thyroid tests: Lab Results  Component Value Date   TSH 1.99 05/31/2016   TSH 4.68 (H) 04/08/2016   TSH 3.28 01/06/2016   TSH 1.59 01/06/2015   TSH 0.60 07/04/2014   TSH 1.27 05/14/2014   TSH 6.45 (H) 04/02/2014   TSH 6.180 (H) 03/05/2014   TSH 0.01 (L) 02/07/2014   TSH 0.02 (L) 12/13/2013   FREET4 0.91 05/31/2016   FREET4 0.75 04/08/2016   FREET4 0.72 01/06/2016   FREET4 0.98 01/06/2015   FREET4 0.91 07/04/2014   FREET4 0.94 05/14/2014   FREET4 0.64 04/02/2014   FREET4 0.50 (L) 03/05/2014   FREET4 1.02 02/07/2014   FREET4 3.67 (H) 12/13/2013  11/14/2013: TSH 1.4, fT4 0.4, TPO Abs 253, ATA 41.2  Lab Results  Component Value Date   TSI 364 (H) 12/13/2013   Pt is on levothyroxine 88 mcg daily, taken: - may miss it in am when having a migraine + N/V >> 1x a month: - in am - fasting - at least 1-2h from b'fast - + Ca later in the day - occas. - no Fe, PPIs - + MVI at suppertime - not on  Biotin  Pt denies: - feeling nodules in neck - hoarseness - choking - SOB with lying down  No FH of thyroid cancer. No h/o radiation tx to head or neck.  No seaweed or kelp. No recent contrast studies. No herbal supplements. No Biotin use. No recent steroids use.   Mother had cancer and pt was her caregiver. She passed away in 05/08/15.  ROS: Constitutional: + intentional weight loss, no fatigue, no subjective hyperthermia, + subjective hypothermia Eyes: no blurry vision, no xerophthalmia ENT: no sore throat, no nodules palpated in throat, + dysphagia, no odynophagia, no hoarseness Cardiovascular: no CP/no SOB/no palpitations/+ leg swelling Respiratory: no cough/no SOB/no wheezing Gastrointestinal: no N/no V/no D/no C/no acid reflux Musculoskeletal: no muscle aches/no joint aches Skin: no rashes, + hair loss Neurological: no tremors/no numbness/no tingling/no dizziness, + HA  I reviewed pt's medications, allergies, PMH, social hx, family hx, and changes were documented in the history of present illness. Otherwise, unchanged from my initial visit note.  PE: BP 132/88 (BP Location: Left Arm, Patient Position: Sitting)   Pulse 81   Wt 208 lb (94.3 kg)   SpO2 97%   BMI 39.30 kg/m  Body mass index is 39.3 kg/m.  Wt Readings from  Last 3 Encounters:  01/04/17 208 lb (94.3 kg)  04/08/16 214 lb 3.2 oz (97.2 kg)  01/06/16 211 lb (95.7 kg)   Constitutional: overweight, in NAD Eyes: PERRLA, EOMI, no exophthalmos ENT: moist mucous membranes, no thyromegaly, no cervical lymphadenopathy Cardiovascular: RRR, No MRG Respiratory: CTA B Gastrointestinal: abdomen soft, NT, ND, BS+ Musculoskeletal: no deformities, strength intact in all 4 Skin: moist, warm, + rash Neurological: + tremor with outstretched hands, DTR 4/4 in all 4  ASSESSMENT: 1. H/o Graves ds.  2. Postablative Hypothyroidism  3. Tremors  PLAN:  1. And 2. Patient Was found to have thyrotoxicosis during investigation  for involuntary movements (akathisia?). She also had clear thyrotoxic symptoms: Weight loss, heat intolerance, palpitations, tachycardia, hyper reflexia. An uptake and scan showed a pattern of Graves' disease. She was initially on methimazole and beta blocker but eventually had to have RAI ablation in 12/2013, after which the involuntary movements greatly decreased, with only occasional body tremors. We had to start levothyroxine for post-ablative hypothyroidism afterwards.  - thyroid labs reviewed with pt >> at last visit, TSH was slightly high >> we increased her levothyroxine from 75 to 88 mcg daily. Subsequent TFTs were normal, last in 05/2016. - she continues on LT4 88 mcg daily - pt feels good on this dose. - we discussed about taking the thyroid hormone every day, with water, >30 minutes before breakfast, separated by >4 hours from acid reflux medications, calcium, iron, multivitamins. Pt. is taking it correctly - will check thyroid tests today: TSH and fT4 - If labs are abnormal, she will need to return for repeat TFTs in 1.5 months - needs flu shot today >> given - OTW, RTC in 6 mo.   3. Tremors - she is wondering whether her tremors are still related to her h/o Graves ds - this is unlikely, but I would like to check her TSI antibodies and will also add TPO and ATA Abs today, as they were all elevated in the past Needs refills - needs 90 days  Component     Latest Ref Rng & Units 01/04/2017  TSH     0.35 - 4.50 uIU/mL 2.04  T4,Free(Direct)     0.60 - 1.60 ng/dL 1.61  TSI     <096 % baseline <89  Thyroperoxidase Ab SerPl-aCnc     <9 IU/mL 28 (H)  Thyroglobulin Ab     < or = 1 IU/mL 8 (H)   TPO and ATA Abs are still high but lower than before. TSI not elevated. TFTs normal.  Carlus Pavlov, MD PhD [facility]

## 2017-01-06 ENCOUNTER — Encounter: Payer: Self-pay | Admitting: Internal Medicine

## 2017-01-06 MED ORDER — LEVOTHYROXINE SODIUM 88 MCG PO TABS: 88.0000 ug | ORAL_TABLET | Freq: Every day | ORAL | 3 refills | Status: DC

## 2017-01-09 LAB — THYROID STIMULATING IMMUNOGLOBULIN: TSI: <89 % baseline (ref <140)

## 2017-01-09 LAB — THYROID PEROXIDASE ANTIBODY: THYROID PEROXIDASE ANTIBODY: 28 [IU]/mL — AB (ref <9)

## 2017-01-09 LAB — THYROGLOBULIN ANTIBODY: Thyroglobulin Ab: 8 IU/mL — ABNORMAL HIGH (ref <=1)

## 2017-01-29 ENCOUNTER — Encounter: Payer: Self-pay | Admitting: Internal Medicine

## 2017-03-15 DIAGNOSIS — Z01419 Encounter for gynecological examination (general) (routine) without abnormal findings: Secondary | ICD-10-CM | POA: Diagnosis not present

## 2017-03-15 DIAGNOSIS — Z1231 Encounter for screening mammogram for malignant neoplasm of breast: Secondary | ICD-10-CM | POA: Diagnosis not present

## 2017-03-15 DIAGNOSIS — Z6834 Body mass index (BMI) 34.0-34.9, adult: Secondary | ICD-10-CM | POA: Diagnosis not present

## 2017-03-15 DIAGNOSIS — Z124 Encounter for screening for malignant neoplasm of cervix: Secondary | ICD-10-CM | POA: Diagnosis not present

## 2017-04-01 DIAGNOSIS — K625 Hemorrhage of anus and rectum: Secondary | ICD-10-CM | POA: Diagnosis not present

## 2017-07-04 ENCOUNTER — Ambulatory Visit: Payer: BLUE CROSS/BLUE SHIELD | Admitting: Internal Medicine

## 2017-07-06 DIAGNOSIS — Z131 Encounter for screening for diabetes mellitus: Secondary | ICD-10-CM | POA: Diagnosis not present

## 2017-07-06 DIAGNOSIS — M79676 Pain in unspecified toe(s): Secondary | ICD-10-CM | POA: Diagnosis not present

## 2017-07-06 DIAGNOSIS — Z136 Encounter for screening for cardiovascular disorders: Secondary | ICD-10-CM | POA: Diagnosis not present

## 2017-08-02 DIAGNOSIS — M79645 Pain in left finger(s): Secondary | ICD-10-CM | POA: Diagnosis not present

## 2017-08-31 DIAGNOSIS — R05 Cough: Secondary | ICD-10-CM | POA: Diagnosis not present

## 2017-08-31 DIAGNOSIS — J029 Acute pharyngitis, unspecified: Secondary | ICD-10-CM | POA: Diagnosis not present

## 2017-08-31 DIAGNOSIS — H6692 Otitis media, unspecified, left ear: Secondary | ICD-10-CM | POA: Diagnosis not present

## 2017-09-06 DIAGNOSIS — M79645 Pain in left finger(s): Secondary | ICD-10-CM | POA: Diagnosis not present

## 2017-09-06 DIAGNOSIS — J019 Acute sinusitis, unspecified: Secondary | ICD-10-CM | POA: Diagnosis not present

## 2017-09-08 ENCOUNTER — Encounter: Payer: Self-pay | Admitting: Internal Medicine

## 2017-09-08 ENCOUNTER — Ambulatory Visit: Payer: BLUE CROSS/BLUE SHIELD | Admitting: Internal Medicine

## 2017-09-08 VITALS — BP 154/98 | HR 75 | Ht 61.0 in | Wt 206.2 lb

## 2017-09-08 DIAGNOSIS — Z8639 Personal history of other endocrine, nutritional and metabolic disease: Secondary | ICD-10-CM | POA: Diagnosis not present

## 2017-09-08 DIAGNOSIS — E890 Postprocedural hypothyroidism: Secondary | ICD-10-CM

## 2017-09-08 DIAGNOSIS — R251 Tremor, unspecified: Secondary | ICD-10-CM | POA: Diagnosis not present

## 2017-09-08 NOTE — Patient Instructions (Addendum)
Please come back for labs.  Please continue Levothyroxine 88 mcg daily.  Take the thyroid hormone every day, with water, at least 30 minutes before breakfast, separated by at least 4 hours from: - acid reflux medications - calcium - iron - multivitamins  Please return in 1 year.

## 2017-09-08 NOTE — Progress Notes (Signed)
Patient ID: Janice Mccoy, female   DOB: 06/24/1970, 47 y.o.   MRN: [id number]   HPI  Janice Mccoy is a 47 y.o.-year-old female, returning for f/u for postablative hypothyroidism, after RAI tx for Graves ds. Last visit 8 mo ago  She has a sinus inf + eat infection >> on ABx. She also has a cough.  Reviewed and addended history: Pt started to have abnormal involuntary movements (akathisia?) around Easter 2015 >> she was started on Ativan >> no effect >> Propranolol 40 mg 2x a day >> this helped with the rapid heartbeat, tremors and migraines >> PCP checked TFTs >> thyrotoxicosis >> started on MMI 10 mg 3x a day >>  a repeat set of TFTs was improved >> she was advised to stop MMI on 11/19/2013 in preparation for an Uptake and Scan. She stopped the Inderal around the same time.   She had an Uptake and Scan on 11/29/2013, showing Graves ds, with uniform scan and an elevated uptake (50%).  She had RAI tx on 01/04/2014.   She developed postablative hypothyroidism >> started LT4 50 mcg >> 75 mcg daily >> dose increased to 88 mcg daily.  At last visit, we stopped selenium.  Pt is on levothyroxine 88 mcg daily, taken: - rarely misses it - in am - fasting - ~ 2h from b'fast - + Ca later in the day, Tums at bedtime - + MVI with dinner - no Fe, PPIs - not on Biotin  TFTs have been normal more recently: Lab Results  Component Value Date   TSH 2.04 01/04/2017   TSH 1.99 05/31/2016   TSH 4.68 (H) 04/08/2016   TSH 3.28 01/06/2016   TSH 1.59 01/06/2015   TSH 0.60 07/04/2014   TSH 1.27 05/14/2014   TSH 6.45 (H) 04/02/2014   TSH 6.180 (H) 03/05/2014   TSH 0.01 (L) 02/07/2014   FREET4 0.97 01/04/2017   FREET4 0.91 05/31/2016   FREET4 0.75 04/08/2016   FREET4 0.72 01/06/2016   FREET4 0.98 01/06/2015   FREET4 0.91 07/04/2014   FREET4 0.94 05/14/2014   FREET4 0.64 04/02/2014   FREET4 0.50 (L) 03/05/2014   FREET4 1.02 02/07/2014  11/14/2013: TSH 1.4, fT4 0.4, TPO Abs 253, ATA  41.2  TSIs normalized: Lab Results  Component Value Date   TSI <89 01/04/2017   TSI 364 (H) 12/13/2013   Also, her TPO and ATA antibodies are not elevated: Component     Latest Ref Rng & Units 01/04/2017  Thyroperoxidase Ab SerPl-aCnc     <9 IU/mL 28 (H)  Thyroglobulin Ab     < or = 1 IU/mL 8 (H)   Pt denies: - feeling nodules in neck - hoarseness - dysphagia - choking - SOB with lying down  No FH of thyroid cancer. No h/o radiation tx to head or neck.  No seaweed or kelp. No recent contrast studies. No herbal supplements. No Biotin use. No recent steroids use.   Mother had cancer and pt was her caregiver. She passed away in 03/24/15.  ROS: Constitutional: no weight gain/no weight loss, no fatigue, no subjective hyperthermia, no subjective hypothermia Eyes: no blurry vision, no xerophthalmia ENT: no sore throat, + see HPI Cardiovascular: no CP/no SOB/no palpitations/no leg swelling Respiratory: + cough/no SOB/no wheezing Gastrointestinal: no N/no V/no D/no C/no acid reflux Musculoskeletal: no muscle aches/no joint aches Skin: no rashes, no hair loss Neurological: + tremors/no numbness/no tingling/no dizziness  I reviewed pt's medications, allergies, PMH, social hx, family hx, and  changes were documented in the history of present illness. Otherwise, unchanged from my initial visit note.  Past Medical History:  Diagnosis Date  . Epilepsy (HCC)    childhood  . Heart murmur    Past Surgical History:  Procedure Laterality Date  . CYSTECTOMY     Social History   Socioeconomic History  . Marital status: Single    Spouse name: Not on file  . Number of children: 0  . Years of education: 2  . Highest education level: Not on file  Occupational History  . Occupation: live in caregiver  Social Needs  . Financial resource strain: Not on file  . Food insecurity:    Worry: Not on file    Inability: Not on file  . Transportation needs:    Medical: Not on file     Non-medical: Not on file  Tobacco Use  . Smoking status: Never Smoker  . Smokeless tobacco: Never Used  Substance and Sexual Activity  . Alcohol use: No  . Drug use: No  . Sexual activity: Not on file  Lifestyle  . Physical activity:    Days per week: Not on file    Minutes per session: Not on file  . Stress: Not on file  Relationships  . Social connections:    Talks on phone: Not on file    Gets together: Not on file    Attends religious service: Not on file    Active member of club or organization: Not on file    Attends meetings of clubs or organizations: Not on file    Relationship status: Not on file  . Intimate partner violence:    Fear of current or ex partner: Not on file    Emotionally abused: Not on file    Physically abused: Not on file    Forced sexual activity: Not on file  Other Topics Concern  . Not on file  Social History Narrative   Single, no children   Right handed    12 th grade   1/2-1 cup daily   Current Outpatient Medications on File Prior to Visit  Medication Sig Dispense Refill  . JINTELI 1-5 MG-MCG TABS tablet Take 1 tablet by mouth daily.  0  . levothyroxine (SYNTHROID, LEVOTHROID) 88 MCG tablet Take 1 tablet (88 mcg total) by mouth daily. 90 tablet 3  . Multiple Vitamin (MULTIVITAMIN WITH MINERALS) TABS tablet Take 1 tablet by mouth daily.    Marland Kitchen omega-3 acid ethyl esters (LOVAZA) 1 G capsule Take 1 g by mouth daily.    . vitamin C (ASCORBIC ACID) 500 MG tablet Take 500 mg by mouth daily.     No current facility-administered medications on file prior to visit.    Allergies  Allergen Reactions  . Amoxicillin Rash   No family history on file.  PE: BP (!) 154/98   Pulse 75   Ht  (1.549 m)   Wt 206 lb 3.2 oz (93.5 kg)   SpO2 97%   BMI 38.96 kg/m  Body mass index is 38.96 kg/m.  Wt Readings from Last 3 Encounters:  09/08/17 206 lb 3.2 oz (93.5 kg)  01/04/17 208 lb (94.3 kg)  04/08/16 214 lb 3.2 oz (97.2 kg)   Constitutional:  overweight, in NAD Eyes: PERRLA, EOMI, no exophthalmos ENT: moist mucous membranes, no thyromegaly, no cervical lymphadenopathy Cardiovascular: RRR, No MRG Respiratory: CTA B Gastrointestinal: abdomen soft, NT, ND, BS+ Musculoskeletal: no deformities, strength intact in all 4 Skin: moist, warm,  no rashes Neurological: + tremor with outstretched hands, DTR 4/4 in all 4  ASSESSMENT: 1. H/o Graves ds.  2. Postablative Hypothyroidism  3 Tremors  PLAN:  1. And 2.  Patient was found to have thyrotoxicosis during investigation for involuntary movements (akathisia?).  She had clear thyrotoxic symptoms at that time: Weight loss, heat intolerance, palpitations, tachycardia, hyperreflexia.  An uptake and scan showed a pattern of Graves' disease.  She was initially on methimazole and beta-blocker but eventually had to have RAI ablation in 12/2013, after which the involuntary movements gradually decreased with only occasional body tremors now.  She does continue to have some tremors for which we checked TPO and ATA antibodies  at last visit and these were normal. TSI antibodies were checked at last visit and they have decreased to the normal range now.  She developed post ablative hypothyroidism and she is now on levothyroxine. - latest thyroid labs reviewed with pt >> normal is 12/2016 - she continues on LT4 88 mcg daily - pt feels good on this dose. - we discussed about taking the thyroid hormone every day, with water, >30 minutes before breakfast, separated by >4 hours from acid reflux medications, calcium, iron, multivitamins. Pt. is taking it correctly. - will check thyroid tests soon (would like to return for these when feeling a little better): TSH and fT4 - If labs are abnormal, she will need to return for repeat TFTs in 1.5 months - OTW< I will see her in 1 year  3. Tremors - she is wondering if her tremors are not permanent neurologic damage from Graves ds. We discussed that this would be  unusual, ut I cannot rule this out.   Orders Placed This Encounter  Procedures  . TSH  . T4, free    Carlus Pavlov, MD PhD [facility]

## 2017-09-15 ENCOUNTER — Encounter: Payer: Self-pay | Admitting: Internal Medicine

## 2017-09-21 ENCOUNTER — Other Ambulatory Visit (INDEPENDENT_AMBULATORY_CARE_PROVIDER_SITE_OTHER): Payer: BLUE CROSS/BLUE SHIELD

## 2017-09-21 DIAGNOSIS — E890 Postprocedural hypothyroidism: Secondary | ICD-10-CM | POA: Diagnosis not present

## 2017-09-21 LAB — TSH: TSH: 0.86 u[IU]/mL (ref 0.35–4.50)

## 2017-09-21 LAB — T4, FREE: FREE T4: 1.11 ng/dL (ref 0.60–1.60)

## 2017-11-11 DIAGNOSIS — Z6834 Body mass index (BMI) 34.0-34.9, adult: Secondary | ICD-10-CM | POA: Diagnosis not present

## 2017-11-11 DIAGNOSIS — N939 Abnormal uterine and vaginal bleeding, unspecified: Secondary | ICD-10-CM | POA: Diagnosis not present

## 2018-02-06 ENCOUNTER — Telehealth: Payer: Self-pay | Admitting: Internal Medicine

## 2018-02-06 MED ORDER — LEVOTHYROXINE SODIUM 88 MCG PO TABS: 88.0000 ug | ORAL_TABLET | Freq: Every day | ORAL | 3 refills | Status: DC

## 2018-02-06 NOTE — Telephone Encounter (Signed)
Patient requests RX with refills for levothyroxine (SYNTHROID, LEVOTHROID) 88 MCG tablet be sent to [facility]

## 2018-02-06 NOTE — Telephone Encounter (Signed)
RX sent

## 2018-09-08 ENCOUNTER — Other Ambulatory Visit: Payer: Self-pay

## 2018-09-12 ENCOUNTER — Other Ambulatory Visit: Payer: Self-pay

## 2018-09-12 ENCOUNTER — Encounter: Payer: Self-pay | Admitting: Internal Medicine

## 2018-09-12 ENCOUNTER — Ambulatory Visit: Payer: BLUE CROSS/BLUE SHIELD | Admitting: Internal Medicine

## 2018-09-12 VITALS — BP 148/62 | HR 77 | Ht 61.0 in | Wt 201.0 lb

## 2018-09-12 DIAGNOSIS — Z8639 Personal history of other endocrine, nutritional and metabolic disease: Secondary | ICD-10-CM

## 2018-09-12 DIAGNOSIS — E890 Postprocedural hypothyroidism: Secondary | ICD-10-CM | POA: Diagnosis not present

## 2018-09-12 LAB — T4, FREE: Free T4: 0.95 ng/dL (ref 0.60–1.60)

## 2018-09-12 LAB — TSH: TSH: 1.55 u[IU]/mL (ref 0.35–4.50)

## 2018-09-12 NOTE — Progress Notes (Signed)
Patient ID: Janice Mccoy, female   DOB: 07/19/1970, 48 y.o.   MRN: [id number]   HPI  Janice Mccoy is a 48 y.o.-year-old female, returning for f/u for postablative hypothyroidism, after RAI tx for Graves ds. Last visit 1 year ago.  Reviewed and addended history: Pt started to have abnormal involuntary movements (akathisia?) around Easter 2015 >> she was started on Ativan >> no effect >> Propranolol 40 mg 2x a day >> this helped with the rapid heartbeat, tremors and migraines >> PCP checked TFTs >> thyrotoxicosis >> started on MMI 10 mg 3x a day >>  a repeat set of TFTs was improved >> she was advised to stop MMI on 11/19/2013 in preparation for an Uptake and Scan. She stopped the Inderal around the same time.   She had an Uptake and Scan on 11/29/2013, showing Graves ds, with uniform scan and an elevated uptake (50%).  She had RAI tx on 01/04/2014.   She developed postablative hypothyroidism >> started LT4 50 mcg >> 75 mcg daily >> dose increased to 88 mcg daily.  She was previously on selenium, now off.  Pt is on levothyroxine 88 mcg daily, taken: - in am - fasting - Approximately 2 hours from b'fast - + Ca, MVI - all later in the day - no Fe, PPIs - not on Biotin  Review TFTs: Lab Results  Component Value Date   TSH 0.86 09/21/2017   TSH 2.04 01/04/2017   TSH 1.99 05/31/2016   TSH 4.68 (H) 04/08/2016   TSH 3.28 01/06/2016   TSH 1.59 01/06/2015   TSH 0.60 07/04/2014   TSH 1.27 05/14/2014   TSH 6.45 (H) 04/02/2014   TSH 6.180 (H) 03/05/2014   FREET4 1.11 09/21/2017   FREET4 0.97 01/04/2017   FREET4 0.91 05/31/2016   FREET4 0.75 04/08/2016   FREET4 0.72 01/06/2016   FREET4 0.98 01/06/2015   FREET4 0.91 07/04/2014   FREET4 0.94 05/14/2014   FREET4 0.64 04/02/2014   FREET4 0.50 (L) 03/05/2014  11/14/2013: TSH 1.4, fT4 0.4, TPO Abs 253, ATA 41.2  Her TSI's normalized: Lab Results  Component Value Date   TSI <89 01/04/2017   TSI 364 (H) 12/13/2013   Also,  her TPO and ATA antibodies were elevated: Component     Latest Ref Rng & Units 01/04/2017  Thyroperoxidase Ab SerPl-aCnc     <9 IU/mL 28 (H)  Thyroglobulin Ab     < or = 1 IU/mL 8 (H)   Pt denies: - feeling nodules in neck - hoarseness - dysphagia - choking - SOB with lying down  No FH of thyroid cancer. No h/o radiation tx to head or neck.  No seaweed or kelp. No recent contrast studies. No herbal supplements. No Biotin use. No recent steroids use.   Mother had cancer and pt was her caregiver. She passed away in 03/2015.  ROS: Constitutional: no weight gain/no weight loss, no fatigue, no subjective hyperthermia, no subjective hypothermia Eyes: no blurry vision, no xerophthalmia ENT: no sore throat, + see HPI, + congestion Cardiovascular: no CP/no SOB/no palpitations/no leg swelling Respiratory: no cough/no SOB/no wheezing Gastrointestinal: no N/no V/no D/no C/no acid reflux Musculoskeletal: no muscle aches/no joint aches Skin: no rashes, no hair loss Neurological: + tremors/no numbness/no tingling/no dizziness, + 1 migraine 2 weeks ago  I reviewed pt's medications, allergies, PMH, social hx, family hx, and changes were documented in the history of present illness. Otherwise, unchanged from my initial visit note.  Past Medical History:  Diagnosis Date  . Epilepsy (HCC)    childhood  . Heart murmur    Past Surgical History:  Procedure Laterality Date  . CYSTECTOMY     Social History   Socioeconomic History  . Marital status: Single    Spouse name: Not on file  . Number of children: 0  . Years of education: 72  . Highest education level: Not on file  Occupational History  . Occupation: live in caregiver  Social Needs  . Financial resource strain: Not on file  . Food insecurity:    Worry: Not on file    Inability: Not on file  . Transportation needs:    Medical: Not on file    Non-medical: Not on file  Tobacco Use  . Smoking status: Never Smoker  .  Smokeless tobacco: Never Used  Substance and Sexual Activity  . Alcohol use: No  . Drug use: No  . Sexual activity: Not on file  Lifestyle  . Physical activity:    Days per week: Not on file    Minutes per session: Not on file  . Stress: Not on file  Relationships  . Social connections:    Talks on phone: Not on file    Gets together: Not on file    Attends religious service: Not on file    Active member of club or organization: Not on file    Attends meetings of clubs or organizations: Not on file    Relationship status: Not on file  . Intimate partner violence:    Fear of current or ex partner: Not on file    Emotionally abused: Not on file    Physically abused: Not on file    Forced sexual activity: Not on file  Other Topics Concern  . Not on file  Social History Narrative   Single, no children   Right handed    12 th grade   1/2-1 cup daily   Current Outpatient Medications on File Prior to Visit  Medication Sig Dispense Refill  . JINTELI 1-5 MG-MCG TABS tablet Take 1 tablet by mouth daily.  0  . levofloxacin (LEVAQUIN) 750 MG tablet   0  . levothyroxine (SYNTHROID, LEVOTHROID) 88 MCG tablet Take 1 tablet (88 mcg total) by mouth daily. 90 tablet 3  . Multiple Vitamin (MULTIVITAMIN WITH MINERALS) TABS tablet Take 1 tablet by mouth daily.    Marland Kitchen omega-3 acid ethyl esters (LOVAZA) 1 G capsule Take 1 g by mouth daily.    . vitamin C (ASCORBIC ACID) 500 MG tablet Take 500 mg by mouth daily.     No current facility-administered medications on file prior to visit.    Allergies  Allergen Reactions  . Amoxicillin Rash   No family history on file.  PE: BP (!) 148/62   Pulse 77   Ht 5\' 1"  (1.549 m)   Wt 201 lb (91.2 kg)   SpO2 97%   BMI 37.98 kg/m  Body mass index is 37.98 kg/m.  Wt Readings from Last 3 Encounters:  09/12/18 201 lb (91.2 kg)  09/08/17 206 lb 3.2 oz (93.5 kg)  01/04/17 208 lb (94.3 kg)   Constitutional: overweight, in NAD Eyes: PERRLA, EOMI, no  exophthalmos ENT: moist mucous membranes, no thyromegaly, no cervical lymphadenopathy Cardiovascular: RRR, No MRG Respiratory: CTA B Gastrointestinal: abdomen soft, NT, ND, BS+ Musculoskeletal: no deformities, strength intact in all 4 Skin: moist, warm, no rashes Neurological: + tremor with outstretched hands, DTR +4/4  ASSESSMENT: 1. H/o Graves  ds.  2. Postablative Hypothyroidism   PLAN:  1. And 2.  -Patient was found to have thyrotoxicosis during investigation for involuntary movements (akathisia?).  She had clear thyrotoxic symptoms at that time: Weight loss, heat intolerance, palpitations, tachycardia, hyperreflexia.  A thyroid uptake and scan showed a pattern of Graves' disease.  She was initially started on methimazole and beta-blocker, but eventually had to have RAI ablation in 12/2013.  After this, the involuntary movements gradually decreased with only occasional body tremors now.  She continues to have some tremors, for which we checked TPO and ATA antibodies.  These were elevated.  TSI antibodies have decreased to the normal range.  She did develop post ablative hypothyroidism and she is now on levothyroxine. Patient was found to have thyrotoxicosis during investigation for involuntary movements (akathisia?).  She had clear thyrotoxic symptoms at that time: Weight loss, heat intolerance, palpitations, tachycardia, hyperreflexia.  An uptake and scan showed a pattern of Graves' disease.  She was initially on methimazole and beta-blocker but eventually had to have RAI ablation in 12/2013, after which the involuntary movements gradually decreased with only occasional body tremors now.  She does continue to have some tremors for which we checked TPO and ATA antibodies  at last visit and these were normal. TSI antibodies were checked at last visit and they have decreased to the normal range now.  She developed post ablative hypothyroidism and she is now on levothyroxine. - latest thyroid labs  reviewed with pt >> normal 09/2017 - she continues on LT4 88 mcg daily - pt feels good on this dose. - we discussed about taking the thyroid hormone every day, with water, >30 minutes before breakfast, separated by >4 hours from acid reflux medications, calcium, iron, multivitamins. Pt. is taking it correctly. - will check thyroid tests today: TSH and fT4 - If labs are abnormal, she will need to return for repeat TFTs in 1.5 months - otherwise, I will see her back in a year  Orders Placed This Encounter  Procedures  . TSH  . T4, free   - time spent with the patient: 15 min, of which >50% was spent in obtaining information about her symptoms, reviewing her previous labs, evaluations, and treatments, counseling her about her condition (please see the discussed topics above), and developing a plan to further investigate and treat it; she had a number of questions which I addressed.  Office Visit on 09/12/2018  Component Date Value Ref Range Status  . TSH 09/12/2018 1.55  0.35 - 4.50 uIU/mL Final  . Free T4 09/12/2018 0.95  0.60 - 1.60 ng/dL Final   Comment: Specimens from patients who are undergoing biotin therapy and /or ingesting biotin supplements may contain high levels of biotin.  The higher biotin concentration in these specimens interferes with this Free T4 assay.  Specimens that contain high levels  of biotin may cause false high results for this Free T4 assay.  Please interpret results in light of the total clinical presentation of the patient.     Normal labs.  Carlus Pavlov, MD PhD [facility] Endocrinology

## 2018-09-12 NOTE — Patient Instructions (Signed)
Please stop at the lab.  Please  continue Levothyroxine 88 mcg daily.  Take the thyroid hormone every day, with water, at least 30 minutes before breakfast, separated by at least 4 hours from: - acid reflux medications - calcium - iron - multivitamins  Please return in 1 year. 

## 2018-10-30 DIAGNOSIS — R51 Headache: Secondary | ICD-10-CM | POA: Diagnosis not present

## 2019-01-18 DIAGNOSIS — Z23 Encounter for immunization: Secondary | ICD-10-CM | POA: Diagnosis not present

## 2019-01-30 ENCOUNTER — Other Ambulatory Visit: Payer: Self-pay | Admitting: Internal Medicine

## 2019-08-02 DIAGNOSIS — Z6834 Body mass index (BMI) 34.0-34.9, adult: Secondary | ICD-10-CM | POA: Diagnosis not present

## 2019-08-02 DIAGNOSIS — Z1231 Encounter for screening mammogram for malignant neoplasm of breast: Secondary | ICD-10-CM | POA: Diagnosis not present

## 2019-08-02 DIAGNOSIS — Z01419 Encounter for gynecological examination (general) (routine) without abnormal findings: Secondary | ICD-10-CM | POA: Diagnosis not present

## 2019-09-14 ENCOUNTER — Ambulatory Visit: Payer: BC Managed Care – PPO | Admitting: Internal Medicine

## 2019-09-14 ENCOUNTER — Other Ambulatory Visit: Payer: Self-pay

## 2019-09-14 ENCOUNTER — Encounter: Payer: Self-pay | Admitting: Internal Medicine

## 2019-09-14 VITALS — BP 140/90 | HR 92 | Ht 61.0 in | Wt 204.0 lb

## 2019-09-14 DIAGNOSIS — Z8639 Personal history of other endocrine, nutritional and metabolic disease: Secondary | ICD-10-CM

## 2019-09-14 DIAGNOSIS — R251 Tremor, unspecified: Secondary | ICD-10-CM | POA: Diagnosis not present

## 2019-09-14 DIAGNOSIS — E890 Postprocedural hypothyroidism: Secondary | ICD-10-CM | POA: Diagnosis not present

## 2019-09-14 NOTE — Progress Notes (Signed)
Patient ID: Janice Mccoy, female   DOB: [birth year], 49 y.o.   MRN: [id number]  This visit occurred during the SARS-CoV-2 public health emergency.  Safety protocols were in place, including screening questions prior to the visit, additional usage of staff PPE, and extensive cleaning of exam room while observing appropriate contact time as indicated for disinfecting solutions.   HPI  Janice Mccoy is a 49 y.o.-year-old female, returning for f/u for postablative hypothyroidism, after RAI tx for Graves ds. Last visit 1 year ago.  Reviewed and addended history: Pt started to have abnormal involuntary movements (akathisia?) around Easter 2015 >> she was started on Ativan >> no effect >> Propranolol 40 mg 2x a day >> this helped with the rapid heartbeat, tremors and migraines >> PCP checked TFTs >> thyrotoxicosis >> started on MMI 10 mg 3x a day >>  a repeat set of TFTs was improved >> she was advised to stop MMI on 11/19/2013 in preparation for an Uptake and Scan. She stopped the Inderal around the same time.   She had an Uptake and Scan on 11/29/2013, showing Graves ds, with uniform scan and an elevated uptake (50%).  She had RAI tx on 01/04/2014.   She developed postablative hypothyroidism >> started LT4 50 mcg >> 75 mcg daily >> dose increased to 88 mcg daily.  She was previously on selenium, then stopped.  Pt is on levothyroxine 88 mcg daily, taken: - in am - fasting - at least 30 min from b'fast - no Ca except Tums occasionally at bedtime, no MVI  -  no Fe, PPIs - not on Biotin  Reviewed her TFTs: Lab Results  Component Value Date   TSH 1.55 09/12/2018   TSH 0.86 09/21/2017   TSH 2.04 01/04/2017   TSH 1.99 05/31/2016   TSH 4.68 (H) 04/08/2016   TSH 3.28 01/06/2016   TSH 1.59 01/06/2015   TSH 0.60 07/04/2014   TSH 1.27 05/14/2014   TSH 6.45 (H) 04/02/2014   FREET4 0.95 09/12/2018   FREET4 1.11 09/21/2017   FREET4 0.97 01/04/2017   FREET4 0.91 05/31/2016   FREET4 0.75  04/08/2016   FREET4 0.72 01/06/2016   FREET4 0.98 01/06/2015   FREET4 0.91 07/04/2014   FREET4 0.94 05/14/2014   FREET4 0.64 04/02/2014  11/14/2013: TSH 1.4, fT4 0.4, TPO Abs 253, ATA 41.2  TSI's normalized: Lab Results  Component Value Date   TSI <89 01/04/2017   TSI 364 (H) 12/13/2013   Also, her TPO and ATA antibodies were elevated: Component     Latest Ref Rng & Units 01/04/2017  Thyroperoxidase Ab SerPl-aCnc     <9 IU/mL 28 (H)  Thyroglobulin Ab     < or = 1 IU/mL 8 (H)   Pt denies: - feeling nodules in neck - hoarseness - dysphagia - choking - SOB with lying down  No FH of thyroid cancer. No h/o radiation tx to head or neck.  No seaweed or kelp. No recent contrast studies. No herbal supplements. No Biotin use. No recent steroids use.   Mother had cancer and pt was her caregiver. She passed away in 04-30-2015.  ROS: Constitutional: no weight gain/no weight loss, no fatigue, no subjective hyperthermia, no subjective hypothermia Eyes: no blurry vision, no xerophthalmia ENT: no sore throat, + see HPI Cardiovascular: no CP/no SOB/no palpitations/no leg swelling Respiratory: no cough/no SOB/no wheezing Gastrointestinal: no N/no V/no D/no C/no acid reflux Musculoskeletal: no muscle aches/no joint aches Skin: no rashes, no hair loss Neurological: +  tremors/no numbness/no tingling/no dizziness  I reviewed pt's medications, allergies, PMH, social hx, family hx, and changes were documented in the history of present illness. Otherwise, unchanged from my initial visit note.  Past Medical History:  Diagnosis Date  . Epilepsy (HCC)    childhood  . Heart murmur    Past Surgical History:  Procedure Laterality Date  . CYSTECTOMY     Social History   Socioeconomic History  . Marital status: Single    Spouse name: Not on file  . Number of children: 0  . Years of education: 21  . Highest education level: Not on file  Occupational History  . Occupation: live in  caregiver  Tobacco Use  . Smoking status: Never Smoker  . Smokeless tobacco: Never Used  Substance and Sexual Activity  . Alcohol use: No  . Drug use: No  . Sexual activity: Not on file  Other Topics Concern  . Not on file  Social History Narrative   Single, no children   Right handed    12 th grade   1/2-1 cup daily   Social Determinants of Health   Financial Resource Strain:   . Difficulty of Paying Living Expenses:   Food Insecurity:   . Worried About Programme researcher, broadcasting/film/video in the Last Year:   . Barista in the Last Year:   Transportation Needs:   . Freight forwarder (Medical):   Marland Kitchen Lack of Transportation (Non-Medical):   Physical Activity:   . Days of Exercise per Week:   . Minutes of Exercise per Session:   Stress:   . Feeling of Stress :   Social Connections:   . Frequency of Communication with Friends and Family:   . Frequency of Social Gatherings with Friends and Family:   . Attends Religious Services:   . Active Member of Clubs or Organizations:   . Attends Banker Meetings:   Marland Kitchen Marital Status:   Intimate Partner Violence:   . Fear of Current or Ex-Partner:   . Emotionally Abused:   Marland Kitchen Physically Abused:   . Sexually Abused:    Current Outpatient Medications on File Prior to Visit  Medication Sig Dispense Refill  . JINTELI 1-5 MG-MCG TABS tablet Take 1 tablet by mouth daily.  0  . levofloxacin (LEVAQUIN) 750 MG tablet   0  . levothyroxine (SYNTHROID) 88 MCG tablet TAKE 1 TABLET BY MOUTH DAILY 90 tablet 3  . Multiple Vitamin (MULTIVITAMIN WITH MINERALS) TABS tablet Take 1 tablet by mouth daily.    Marland Kitchen omega-3 acid ethyl esters (LOVAZA) 1 G capsule Take 1 g by mouth daily.    . vitamin C (ASCORBIC ACID) 500 MG tablet Take 500 mg by mouth daily.     No current facility-administered medications on file prior to visit.   Allergies  Allergen Reactions  . Cefdinir   . Amoxicillin Rash   No family history on file.  PE: BP 140/90    Pulse 92   Ht 5\' 1"  (1.549 m)   Wt 204 lb (92.5 kg)   SpO2 98%   BMI 38.55 kg/m  Body mass index is 38.55 kg/m.  Wt Readings from Last 3 Encounters:  09/14/19 204 lb (92.5 kg)  09/12/18 201 lb (91.2 kg)  09/08/17 206 lb 3.2 oz (93.5 kg)   Constitutional: overweight, in NAD Eyes: PERRLA, EOMI, no exophthalmos ENT: moist mucous membranes, no thyromegaly, no cervical lymphadenopathy Cardiovascular: RRR, No MRG Respiratory: CTA B Gastrointestinal: abdomen soft,  NT, ND, BS+ Musculoskeletal: no deformities, strength intact in all 4 Skin: moist, warm, no rashes Neurological: + tremor with outstretched hands, DTR +4/4  ASSESSMENT: 1. H/o Graves ds.  2. Postablative Hypothyroidism  3. Tremors  PLAN:  1. And 2.  - Patient was found to have thyrotoxicosis during investigation for involuntary movements (akathisia?).  She had clear thyrotoxic symptoms at that time: Weight loss, heat intolerance, palpitations, tachycardia, hyperreflexia.  An uptake and scan showed a pattern of Graves' disease.  She was initially on methimazole and beta-blocker but eventually had to have RAI ablation in 12/2013, after which the involuntary movements gradually decreased with only occasional body tremors now.  She does continue to have some tremors, for which we checked TPO and ATA antibodies in 2018 and these were elevated.  TSI antibodies were initially elevated and then they decreased to the normal range.  She developed post ablative hypothyroidism and she is now on levothyroxine. - latest thyroid labs reviewed with pt >> normal: Lab Results  Component Value Date   TSH 1.55 09/12/2018   - she continues on LT4 88  mcg daily - pt feels good on this dose. - we discussed about taking the thyroid hormone every day, with water, >30 minutes before breakfast, separated by >4 hours from acid reflux medications, calcium, iron, multivitamins. Pt. is taking it correctly. - will check thyroid tests today: TSH and fT4 -  If labs are abnormal, she will need to return for repeat TFTs in 1.5 months - OTW, I will see her back in a year  3. Tremors - during excitement, either positive or negative - possible essential vs persistent neural damage from her Graves ds. - her car broke down on the way to the appt >> increased tremor now - advised to d/w PCP about possibly starting Inderal  Component     Latest Ref Rng & Units 09/14/2019  T4,Free(Direct)     0.8 - 1.8 ng/dL 1.2  TSH     mIU/L 1.87   Normal TFTs.  We will continue the same levothyroxine dose.  Philemon Kingdom, MD PhD [facility] Endocrinology

## 2019-09-14 NOTE — Patient Instructions (Signed)
Please stop at the lab.  Please  continue Levothyroxine 88 mcg daily.  Take the thyroid hormone every day, with water, at least 30 minutes before breakfast, separated by at least 4 hours from: - acid reflux medications - calcium - iron - multivitamins  Please return in 1 year. 

## 2019-09-15 LAB — TSH: TSH: 1.87 mIU/L

## 2019-09-15 LAB — T4, FREE: Free T4: 1.2 ng/dL (ref 0.8–1.8)

## 2019-09-21 DIAGNOSIS — J019 Acute sinusitis, unspecified: Secondary | ICD-10-CM | POA: Diagnosis not present

## 2020-01-21 ENCOUNTER — Other Ambulatory Visit: Payer: Self-pay | Admitting: Internal Medicine

## 2020-01-22 DIAGNOSIS — Z23 Encounter for immunization: Secondary | ICD-10-CM | POA: Diagnosis not present

## 2020-09-18 ENCOUNTER — Encounter: Payer: Self-pay | Admitting: Internal Medicine

## 2020-09-18 ENCOUNTER — Ambulatory Visit: Payer: BC Managed Care – PPO | Admitting: Internal Medicine

## 2020-09-18 ENCOUNTER — Other Ambulatory Visit: Payer: Self-pay

## 2020-09-18 VITALS — BP 130/88 | HR 93 | Ht 61.0 in | Wt 203.6 lb

## 2020-09-18 DIAGNOSIS — E890 Postprocedural hypothyroidism: Secondary | ICD-10-CM | POA: Diagnosis not present

## 2020-09-18 LAB — T4, FREE: Free T4: 1.06 ng/dL (ref 0.60–1.60)

## 2020-09-18 LAB — TSH: TSH: 4 u[IU]/mL (ref 0.35–4.50)

## 2020-09-18 NOTE — Progress Notes (Signed)
Patient ID: Janice Mccoy, female   DOB: 03/16/1971, 50 y.o.   MRN: [id number]  This visit occurred during the SARS-CoV-2 public health emergency.  Safety protocols were in place, including screening questions prior to the visit, additional usage of staff PPE, and extensive cleaning of exam room while observing appropriate contact time as indicated for disinfecting solutions.   HPI  Janice Mccoy is a 50 y.o.-year-old female, returning for f/u for postablative hypothyroidism, after RAI tx for Graves ds. Last visit 1 year ago.  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  Has seasonal allergies  - but Mucinex helps a lot. She otherwise feels well, without new complaints.  Reviewed  history: Pt started to have abnormal involuntary movements (akathisia?) around Easter 2015 >> she was started on Ativan >> no effect >> Propranolol 40 mg 2x a day >> this helped with the rapid heartbeat, tremors and migraines >> PCP checked TFTs >> thyrotoxicosis >> started on MMI 10 mg 3x a day >>  a repeat set of TFTs was improved >> she was advised to stop MMI on 11/19/2013 in preparation for an Uptake and Scan. She stopped the Inderal around the same time.   She had an Uptake and Scan on 11/29/2013, showing Graves ds, with uniform scan and an elevated uptake (50%).  She had RAI tx on 01/04/2014.   She developed postablative hypothyroidism >> started LT4 50 mcg >> 75 mcg daily >> dose increased to 88 mcg daily.  She was previously on selenium, then stopped.  Pt is on levothyroxine 88 mcg daily, taken: - in am - fasting - at least 30 min from b'fast - no Ca except Tums occasionally at bedtime - no MVI  - no Fe - no PPIs - not on Biotin On Mucinex.  Reviewed her TFTs: Lab Results  Component Value Date   TSH 1.87 09/14/2019   TSH 1.55 09/12/2018   TSH 0.86 09/21/2017   TSH 2.04 01/04/2017   TSH 1.99 05/31/2016   TSH 4.68 (H) 04/08/2016   TSH 3.28 01/06/2016    TSH 1.59 01/06/2015   TSH 0.60 07/04/2014   TSH 1.27 05/14/2014   FREET4 1.2 09/14/2019   FREET4 0.95 09/12/2018   FREET4 1.11 09/21/2017   FREET4 0.97 01/04/2017   FREET4 0.91 05/31/2016   FREET4 0.75 04/08/2016   FREET4 0.72 01/06/2016   FREET4 0.98 01/06/2015   FREET4 0.91 07/04/2014   FREET4 0.94 05/14/2014  11/14/2013: TSH 1.4, fT4 0.4, TPO Abs 253, ATA 41.2  TSI's normalized: Lab Results  Component Value Date   TSI <89 01/04/2017   TSI 364 (H) 12/13/2013   Also, her TPO and ATA antibodies were elevated: Component     Latest Ref Rng & Units 01/04/2017  Thyroperoxidase Ab SerPl-aCnc     <9 IU/mL 28 (H)  Thyroglobulin Ab     < or = 1 IU/mL 8 (H)   Pt denies: - feeling nodules in neck - hoarseness - dysphagia - choking - SOB with lying down  No FH of thyroid cancer. No h/o radiation tx to head or neck.  No herbal supplements. No Biotin use. No recent steroids use.   Mother had cancer and pt was her caregiver. She passed away in 13-Apr-2015.  ROS: Constitutional: no weight gain/no weight loss, no fatigue, + subjective hyperthermia (no AC in the house), no subjective hypothermia Eyes: no blurry vision, no xerophthalmia ENT: no sore throat, + see HPI Cardiovascular: no CP/no SOB/no  palpitations/no leg swelling Respiratory: no cough/no SOB/no wheezing Gastrointestinal: no N/no V/no D/no C/no acid reflux Musculoskeletal: no muscle aches/no joint aches Skin: no rashes, no hair loss Neurological: + mild tremors/no numbness/no tingling/no dizziness  I reviewed pt's medications, allergies, PMH, social hx, family hx, and changes were documented in the history of present illness. Otherwise, unchanged from my initial visit note.  Past Medical History:  Diagnosis Date  . Epilepsy (HCC)    childhood  . Heart murmur    Past Surgical History:  Procedure Laterality Date  . CYSTECTOMY     Social History   Socioeconomic History  . Marital status: Single    Spouse name:  Not on file  . Number of children: 0  . Years of education: 52  . Highest education level: Not on file  Occupational History  . Occupation: live in caregiver  Tobacco Use  . Smoking status: Never Smoker  . Smokeless tobacco: Never Used  Substance and Sexual Activity  . Alcohol use: No  . Drug use: No  . Sexual activity: Not on file  Other Topics Concern  . Not on file  Social History Narrative   Single, no children   Right handed    12 th grade   1/2-1 cup daily   Social Determinants of Health   Financial Resource Strain: Not on file  Food Insecurity: Not on file  Transportation Needs: Not on file  Physical Activity: Not on file  Stress: Not on file  Social Connections: Not on file  Intimate Partner Violence: Not on file   Current Outpatient Medications on File Prior to Visit  Medication Sig Dispense Refill  . estradiol (ESTRACE) 0.5 MG tablet Take 0.5 mg by mouth daily.    Marland Kitchen levofloxacin (LEVAQUIN) 750 MG tablet   0  . levothyroxine (SYNTHROID) 88 MCG tablet TAKE 1 TABLET BY MOUTH DAILY 90 tablet 3  . medroxyPROGESTERone (PROVERA) 2.5 MG tablet 2.5 mg.    . Multiple Vitamin (MULTIVITAMIN WITH MINERALS) TABS tablet Take 1 tablet by mouth daily.    Marland Kitchen omega-3 acid ethyl esters (LOVAZA) 1 G capsule Take 1 g by mouth daily.    . vitamin C (ASCORBIC ACID) 500 MG tablet Take 500 mg by mouth daily.     No current facility-administered medications on file prior to visit.   Allergies  Allergen Reactions  . Cefdinir   . Amoxicillin Rash   No family history on file.  PE: BP 130/88 (BP Location: Right Arm, Patient Position: Sitting, Cuff Size: Normal)   Pulse 93   Ht 5\' 1"  (1.549 m)   Wt 203 lb 9.6 oz (92.4 kg)   SpO2 96%   BMI 38.47 kg/m  Body mass index is 38.47 kg/m.  Wt Readings from Last 3 Encounters:  09/18/20 203 lb 9.6 oz (92.4 kg)  09/14/19 204 lb (92.5 kg)  09/12/18 201 lb (91.2 kg)   Constitutional: overweight, in NAD Eyes: PERRLA, EOMI, no  exophthalmos ENT: moist mucous membranes, no thyromegaly, no cervical lymphadenopathy Cardiovascular: RRR, No MRG Respiratory: CTA B Gastrointestinal: abdomen soft, NT, ND, BS+ Musculoskeletal: no deformities, strength intact in all 4 Skin: moist, warm, no rashes Neurological: + tremor with outstretched hands, DTR +4/4  ASSESSMENT: 1. Postablative Hypothyroidism  PLAN:  1.  - Patient was found to have thyrotoxicosis during investigation for involuntary movements (akathisia?).  She had clear thyrotoxic symptoms at that time: Weight loss, heat intolerance, palpitations, tachycardia, hyperreflexia.  An uptake and scan showed a pattern of Graves'  disease.  She was initially on methimazole and beta-blocker but eventually had to have RAI ablation in 12/2013, after which the involuntary movements gradually decreased with only occasional body tremors now no.  She does continue to have some tremors, for which we checked TPO and ATA antibodies in 2018 and these were elevated.  TSI antibodies were initially elevated and then they decreased to the normal range.  She developed post ablative hypothyroidism and is now on levothyroxine. - latest thyroid labs reviewed with pt. >> normal: Lab Results  Component Value Date   TSH 1.87 09/14/2019  - she continues on LT4 88 mcg daily - pt feels good on this dose. Tremors improved; now only evident when anxious. - we discussed about taking the thyroid hormone every day, with water, >30 minutes before breakfast, separated by >4 hours from acid reflux medications, calcium, iron, multivitamins. Pt. is taking it correctly. - will check thyroid tests today: TSH and fT4 - If labs are abnormal, she will need to return for repeat TFTs in 1.5 months  Needs refills.  Component     Latest Ref Rng & Units 09/18/2020  T4,Free(Direct)     0.60 - 1.60 ng/dL 3.35  TSH     4.56 - 2.56 uIU/mL 4.00  TFTs are excellent.  Carlus Pavlov, MD PhD [facility] Endocrinology

## 2020-09-18 NOTE — Patient Instructions (Signed)
Please stop at the lab.  Please  continue Levothyroxine 88 mcg daily.  Take the thyroid hormone every day, with water, at least 30 minutes before breakfast, separated by at least 4 hours from: - acid reflux medications - calcium - iron - multivitamins  Please return in 1 year. 

## 2020-09-19 MED ORDER — LEVOTHYROXINE SODIUM 88 MCG PO TABS: 88.0000 ug | ORAL_TABLET | Freq: Every day | ORAL | 3 refills | Status: DC

## 2020-10-22 DIAGNOSIS — Z1231 Encounter for screening mammogram for malignant neoplasm of breast: Secondary | ICD-10-CM | POA: Diagnosis not present

## 2020-10-22 DIAGNOSIS — Z6834 Body mass index (BMI) 34.0-34.9, adult: Secondary | ICD-10-CM | POA: Diagnosis not present

## 2020-10-22 DIAGNOSIS — Z01419 Encounter for gynecological examination (general) (routine) without abnormal findings: Secondary | ICD-10-CM | POA: Diagnosis not present

## 2020-10-27 ENCOUNTER — Other Ambulatory Visit: Payer: Self-pay | Admitting: Obstetrics and Gynecology

## 2020-10-27 DIAGNOSIS — R928 Other abnormal and inconclusive findings on diagnostic imaging of breast: Secondary | ICD-10-CM

## 2020-11-14 ENCOUNTER — Ambulatory Visit
Admission: RE | Admit: 2020-11-14 | Discharge: 2020-11-14 | Disposition: A | Discharge status: Home / Self Care | Payer: BC Managed Care – PPO | Source: Ambulatory Visit | Attending: Obstetrics and Gynecology | Admitting: Obstetrics and Gynecology

## 2020-11-14 ENCOUNTER — Other Ambulatory Visit: Payer: Self-pay

## 2020-11-14 ENCOUNTER — Other Ambulatory Visit: Payer: Self-pay | Admitting: Obstetrics and Gynecology

## 2020-11-14 DIAGNOSIS — N6489 Other specified disorders of breast: Secondary | ICD-10-CM | POA: Diagnosis not present

## 2020-11-14 DIAGNOSIS — R928 Other abnormal and inconclusive findings on diagnostic imaging of breast: Secondary | ICD-10-CM

## 2020-11-14 DIAGNOSIS — R922 Inconclusive mammogram: Secondary | ICD-10-CM | POA: Diagnosis not present

## 2020-11-17 HISTORY — PX: BREAST BIOPSY: SHX20

## 2020-11-21 ENCOUNTER — Ambulatory Visit
Admission: RE | Admit: 2020-11-21 | Discharge: 2020-11-21 | Disposition: A | Discharge status: Home / Self Care | Payer: BC Managed Care – PPO | Source: Ambulatory Visit | Attending: Obstetrics and Gynecology | Admitting: Obstetrics and Gynecology

## 2020-11-21 ENCOUNTER — Other Ambulatory Visit: Payer: Self-pay

## 2020-11-21 DIAGNOSIS — R928 Other abnormal and inconclusive findings on diagnostic imaging of breast: Secondary | ICD-10-CM

## 2020-12-05 ENCOUNTER — Other Ambulatory Visit: Payer: Self-pay | Admitting: Obstetrics and Gynecology

## 2020-12-05 DIAGNOSIS — Z09 Encounter for follow-up examination after completed treatment for conditions other than malignant neoplasm: Secondary | ICD-10-CM

## 2020-12-12 ENCOUNTER — Other Ambulatory Visit: Payer: Self-pay | Admitting: Obstetrics and Gynecology

## 2020-12-12 DIAGNOSIS — N6489 Other specified disorders of breast: Secondary | ICD-10-CM

## 2021-01-14 DIAGNOSIS — Z23 Encounter for immunization: Secondary | ICD-10-CM | POA: Diagnosis not present

## 2021-05-25 ENCOUNTER — Ambulatory Visit
Admission: RE | Admit: 2021-05-25 | Discharge: 2021-05-25 | Disposition: A | Discharge status: Home / Self Care | Payer: BC Managed Care – PPO | Source: Ambulatory Visit | Attending: Obstetrics and Gynecology | Admitting: Obstetrics and Gynecology

## 2021-05-25 ENCOUNTER — Other Ambulatory Visit: Payer: Self-pay | Admitting: Obstetrics and Gynecology

## 2021-05-25 DIAGNOSIS — N6489 Other specified disorders of breast: Secondary | ICD-10-CM

## 2021-09-22 ENCOUNTER — Ambulatory Visit: Payer: BC Managed Care – PPO | Admitting: Internal Medicine

## 2021-10-14 ENCOUNTER — Other Ambulatory Visit: Payer: Self-pay | Admitting: Internal Medicine

## 2021-11-24 ENCOUNTER — Other Ambulatory Visit: Payer: BC Managed Care – PPO

## 2021-12-18 ENCOUNTER — Ambulatory Visit: Payer: BC Managed Care – PPO

## 2021-12-18 ENCOUNTER — Ambulatory Visit
Admission: RE | Admit: 2021-12-18 | Discharge: 2021-12-18 | Disposition: A | Discharge status: Home / Self Care | Payer: BC Managed Care – PPO | Source: Ambulatory Visit | Attending: Obstetrics and Gynecology | Admitting: Obstetrics and Gynecology

## 2021-12-18 DIAGNOSIS — N6489 Other specified disorders of breast: Secondary | ICD-10-CM

## 2021-12-28 ENCOUNTER — Encounter: Payer: Self-pay | Admitting: Internal Medicine

## 2021-12-28 ENCOUNTER — Ambulatory Visit: Payer: BC Managed Care – PPO | Admitting: Internal Medicine

## 2021-12-28 VITALS — BP 122/76 | HR 66 | Ht 61.0 in | Wt 177.6 lb

## 2021-12-28 DIAGNOSIS — E890 Postprocedural hypothyroidism: Secondary | ICD-10-CM

## 2021-12-28 LAB — TSH: TSH: 1.99 u[IU]/mL (ref 0.35–5.50)

## 2021-12-28 LAB — T4, FREE: Free T4: 0.94 ng/dL (ref 0.60–1.60)

## 2021-12-28 NOTE — Progress Notes (Signed)
Patient ID: Janice Mccoy, female   DOB: December 16, 1970, 51 y.o.   MRN: [id number]  HPI  Janice Mccoy is a 51 y.o.-year-old female, returning for f/u for postablative hypothyroidism, after RAI tx for Graves ds. Last visit 1 year and 3 months ago.  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  She had a lot of grieving and stress - had diarrhea x 3 weeks. She now works 9 pm to 5 am.  She is very active at work.  She lost 26 pounds since last visit.  She takes LT4 when she gets home in am.   Reviewed  history: Pt started to have abnormal involuntary movements (akathisia?) around Easter 2015 >> she was started on Ativan >> no effect >> Propranolol 40 mg 2x a day >> this helped with the rapid heartbeat, tremors and migraines >> PCP checked TFTs >> thyrotoxicosis >> started on MMI 10 mg 3x a day >>  a repeat set of TFTs was improved >> she was advised to stop MMI on 11/19/2013 in preparation for an Uptake and Scan. She stopped the Inderal around the same time.   She had an Uptake and Scan on 11/29/2013, showing Graves ds, with uniform scan and an elevated uptake (50%).  She had RAI tx on 01/04/2014.   She developed postablative hypothyroidism >> started LT4 50 mcg >> 75 mcg daily >> dose increased to 88 mcg daily.  She was previously on selenium, then stopped.  Pt is on levothyroxine 88 mcg daily, taken: - in am - fasting - at least 30 min from b'fast - no Ca - no MVI  - no Fe - no PPIs - not on Biotin  Reviewed her TFTs: Lab Results  Component Value Date   TSH 4.00 09/18/2020   TSH 1.87 09/14/2019   TSH 1.55 09/12/2018   TSH 0.86 09/21/2017   TSH 2.04 01/04/2017   TSH 1.99 05/31/2016   TSH 4.68 (H) 04/08/2016   TSH 3.28 01/06/2016   TSH 1.59 01/06/2015   TSH 0.60 07/04/2014   FREET4 1.06 09/18/2020   FREET4 1.2 09/14/2019   FREET4 0.95 09/12/2018   FREET4 1.11 09/21/2017   FREET4 0.97 01/04/2017   FREET4 0.91 05/31/2016    FREET4 0.75 04/08/2016   FREET4 0.72 01/06/2016   FREET4 0.98 01/06/2015   FREET4 0.91 07/04/2014  11/14/2013: TSH 1.4, fT4 0.4, TPO Abs 253, ATA 41.2  TSI's normalized: Lab Results  Component Value Date   TSI <89 01/04/2017   TSI 364 (H) 12/13/2013   Also, her TPO and ATA antibodies were elevated: Component     Latest Ref Rng & Units 01/04/2017  Thyroperoxidase Ab SerPl-aCnc     <9 IU/mL 28 (H)  Thyroglobulin Ab     < or = 1 IU/mL 8 (H)   Pt denies: - feeling nodules in neck - hoarseness - dysphagia - choking  No FH of thyroid cancer. No h/o radiation tx to head or neck. No herbal supplements. No Biotin use. No recent steroids use.   Mother had cancer and pt was her caregiver. She passed away in 2015/03/31.  ROS: + see HPI  I reviewed pt's medications, allergies, PMH, social hx, family hx, and changes were documented in the history of present illness. Otherwise, unchanged from my initial visit note.  Past Medical History:  Diagnosis Date   Epilepsy (HCC)    childhood   Heart murmur    Past Surgical History:  Procedure Laterality Date   CYSTECTOMY     Social History   Socioeconomic History   Marital status: Single    Spouse name: Not on file   Number of children: 0   Years of education: 12   Highest education level: Not on file  Occupational History   Occupation: live in caregiver  Tobacco Use   Smoking status: Never   Smokeless tobacco: Never  Substance and Sexual Activity   Alcohol use: No   Drug use: No   Sexual activity: Not on file  Other Topics Concern   Not on file  Social History Narrative   Single, no children   Right handed    12 th grade   1/2-1 cup daily   Social Determinants of Health   Financial Resource Strain: Not on file  Food Insecurity: Not on file  Transportation Needs: Not on file  Physical Activity: Not on file  Stress: Not on file  Social Connections: Not on file  Intimate Partner Violence: Not on file   Current  Outpatient Medications on File Prior to Visit  Medication Sig Dispense Refill   estradiol (ESTRACE) 0.5 MG tablet Take 0.5 mg by mouth daily.     levofloxacin (LEVAQUIN) 750 MG tablet  (Patient not taking: Reported on 09/18/2020)  0   levothyroxine (SYNTHROID) 88 MCG tablet TAKE 1 TABLET BY MOUTH DAILY 90 tablet 3   medroxyPROGESTERone (PROVERA) 2.5 MG tablet 2.5 mg.     No current facility-administered medications on file prior to visit.   Allergies  Allergen Reactions   Cefdinir    Amoxicillin Rash   No family history on file.  PE: BP 122/76 (BP Location: Right Arm, Patient Position: Sitting, Cuff Size: Normal)   Pulse 66   Ht 5\' 1"  (1.549 m)   Wt 177 lb 9.6 oz (80.6 kg)   SpO2 98%   BMI 33.56 kg/m    Wt Readings from Last 3 Encounters:  12/28/21 177 lb 9.6 oz (80.6 kg)  09/18/20 203 lb 9.6 oz (92.4 kg)  09/14/19 204 lb (92.5 kg)   Constitutional: overweight, in NAD Eyes: EOMI, no exophthalmos ENT: moist mucous membranes, no thyromegaly, no cervical lymphadenopathy Cardiovascular: RRR, No MRG Respiratory: CTA B Musculoskeletal: no deformities Skin: moist, warm, no rashes Neurological: + Very mild tremor with outstretched hands  ASSESSMENT: 1. Postablative Hypothyroidism  PLAN:  1.  - Patient with history of thyrotoxicosis found during investigation for involuntary movements (akathisia?).  She had clear thyrotoxic symptoms at that time: Weight loss, heat intolerance, palpitations, tachycardia, hyperreflexia.  A thyroid uptake and scan was consistent with Graves' disease.  She was initially on methimazole and beta-blocker but eventually we had to do RAI ablation in 12/2013, after which the involuntary movements gradually decreased with only occasional body tremors now.  She still continues to have some tremors.  For this reason, we checked TPO and ATA antibodies in 2018 and these were elevated.  TSI antibodies were initially elevated and then they decreased to the normal  range.  Her tremors are now only evident when anxious. -She developed post ablative hypothyroidism and she is currently on levothyroxine - latest thyroid labs reviewed with pt. >> normal: Lab Results  Component Value Date   TSH 4.00 09/18/2020  - she continues on LT4 88 mcg daily - pt feels good on this dose.  She lost a significant amount of weight, 26 pounds, since last visit, due to being active at work.  We sometimes have to reduce the  dose of levothyroxine when patients lose weight, however, she was already on a relatively low dose, so I doubt we need to reduce the dose further. - we discussed about taking the thyroid hormone every day, with water, >30 minutes before breakfast, separated by >4 hours from acid reflux medications, calcium, iron, multivitamins. Pt. is taking it correctly. - will check thyroid tests today: TSH and fT4 - If labs are abnormal, she will need to return for repeat TFTs in 1.5 months - OTW, I will see her back in a year  Component     Latest Ref Rng 12/28/2021  TSH     0.35 - 5.50 uIU/mL 1.99   T4,Free(Direct)     0.60 - 1.60 ng/dL 0.94   Excellent TFTs.  We will continue the same dose of levothyroxine for now.  - Total time spent for the visit: 25 min, in precharting, obtaining medical information from the chart and from the patient, reviewing her  previous labs, evaluations, and treatments, reviewing her symptoms, counseling her about her condition (please see the discussed topics above), and developing a plan to further investigate and treat it.  Philemon Kingdom, MD PhD [facility] Endocrinology

## 2021-12-28 NOTE — Patient Instructions (Signed)
Please stop at the lab.  Please  continue Levothyroxine 88 mcg daily.  Take the thyroid hormone every day, with water, at least 30 minutes before breakfast, separated by at least 4 hours from: - acid reflux medications - calcium - iron - multivitamins  Please return in 1 year. 

## 2022-01-03 ENCOUNTER — Emergency Department (HOSPITAL_COMMUNITY)
Admission: EM | Admit: 2022-01-03 | Discharge: 2022-01-03 | Disposition: A | Discharge status: Home / Self Care | Payer: BC Managed Care – PPO | Attending: Emergency Medicine | Admitting: Emergency Medicine

## 2022-01-03 ENCOUNTER — Emergency Department (HOSPITAL_COMMUNITY): Payer: BC Managed Care – PPO

## 2022-01-03 ENCOUNTER — Encounter (HOSPITAL_COMMUNITY): Payer: Self-pay

## 2022-01-03 DIAGNOSIS — M5432 Sciatica, left side: Secondary | ICD-10-CM

## 2022-01-03 DIAGNOSIS — M545 Low back pain, unspecified: Secondary | ICD-10-CM | POA: Diagnosis present

## 2022-01-03 DIAGNOSIS — M5442 Lumbago with sciatica, left side: Secondary | ICD-10-CM | POA: Insufficient documentation

## 2022-01-03 MED ORDER — OXYCODONE-ACETAMINOPHEN 5-325 MG PO TABS: 1.0000 | ORAL_TABLET | ORAL | 0 refills | Status: DC | PRN

## 2022-01-03 MED ORDER — PREDNISONE 10 MG PO TABS: 20.0000 mg | ORAL_TABLET | Freq: Every day | ORAL | 0 refills | Status: DC

## 2022-01-03 MED ORDER — METHYLPREDNISOLONE SODIUM SUCC 125 MG IJ SOLR
125.0000 mg | Freq: Once | INTRAMUSCULAR | Status: AC
  Administered 2022-01-03: 125 mg via INTRAMUSCULAR
  Filled 2022-01-03: qty 2

## 2022-01-03 MED ORDER — ONDANSETRON 4 MG PO TBDP
4.0000 mg | ORAL_TABLET | Freq: Once | ORAL | Status: AC
  Administered 2022-01-03: 4 mg via ORAL
  Filled 2022-01-03: qty 1

## 2022-01-03 MED ORDER — HYDROMORPHONE HCL 1 MG/ML IJ SOLN
1.0000 mg | Freq: Once | INTRAMUSCULAR | Status: AC
  Administered 2022-01-03: 1 mg via INTRAMUSCULAR
  Filled 2022-01-03: qty 1

## 2022-01-03 NOTE — ED Provider Notes (Signed)
[facility] DEPT Provider Note   CSN: [id number] Arrival date & time: 01/03/22  1336     History  Chief Complaint  Patient presents with   Hip Pain    Janice Mccoy is a 51 y.o. female.  Patient complains of back pain radiating down her left leg.  Patient has no other medical problems except obesity  The history is provided by the patient and medical records. No language interpreter was used.  Hip Pain This is a new problem. The current episode started more than 2 days ago. The problem occurs constantly. The problem has not changed since onset.Pertinent negatives include no chest pain, no abdominal pain and no headaches. The symptoms are aggravated by bending. Nothing relieves the symptoms. She has tried nothing for the symptoms. The treatment provided no relief.       Home Medications Prior to Admission medications   Medication Sig Start Date End Date Taking? Authorizing Provider  oxyCODONE-acetaminophen (PERCOCET) 5-325 MG tablet Take 1 tablet by mouth every 4 (four) hours as needed. 01/03/22  Yes Milton Ferguson, MD  predniSONE (DELTASONE) 10 MG tablet Take 2 tablets (20 mg total) by mouth daily. 01/03/22  Yes Milton Ferguson, MD  estradiol (ESTRACE) 0.5 MG tablet Take 0.5 mg by mouth daily. 08/02/19   [provider]  levofloxacin (LEVAQUIN) 750 MG tablet  09/06/17   [provider]  levothyroxine (SYNTHROID) 88 MCG tablet TAKE 1 TABLET BY MOUTH DAILY 10/15/21   Philemon Kingdom, MD  medroxyPROGESTERone (PROVERA) 2.5 MG tablet 2.5 mg. 08/02/19   [provider]      Allergies    Cefdinir and Amoxicillin    Review of Systems   Review of Systems  Constitutional:  Negative for appetite change and fatigue.  HENT:  Negative for congestion, ear discharge and sinus pressure.   Eyes:  Negative for discharge.  Respiratory:  Negative for cough.   Cardiovascular:  Negative for chest pain.  Gastrointestinal:  Negative for  abdominal pain and diarrhea.  Genitourinary:  Negative for frequency and hematuria.  Musculoskeletal:  Positive for back pain.  Skin:  Negative for rash.  Neurological:  Negative for seizures and headaches.  Psychiatric/Behavioral:  Negative for hallucinations.     Physical Exam Updated Vital Signs BP (!) 184/94 (BP Location: Right Arm)   Pulse 70   Temp 98.1 F (36.7 C) (Oral)   Resp 16   LMP 03/22/2016 (Exact Date)   SpO2 98%  Physical Exam Constitutional:      Appearance: She is well-developed.  HENT:     Head: Normocephalic.  Eyes:     General: No scleral icterus.    Conjunctiva/sclera: Conjunctivae normal.  Neck:     Thyroid: No thyromegaly.  Cardiovascular:     Rate and Rhythm: Normal rate and regular rhythm.     Heart sounds: No murmur heard.    No friction rub. No gallop.  Pulmonary:     Breath sounds: No stridor. No wheezing or rales.  Chest:     Chest wall: No tenderness.  Abdominal:     General: There is no distension.     Tenderness: There is no abdominal tenderness. There is no rebound.  Musculoskeletal:     Cervical back: Neck supple.     Comments: Tender lumbar spine with positive straight leg raise on left  Lymphadenopathy:     Cervical: No cervical adenopathy.  Skin:    Findings: No erythema or rash.  Neurological:  Mental Status: She is oriented to person, place, and time.     Motor: No abnormal muscle tone.     Coordination: Coordination normal.  Psychiatric:        Behavior: Behavior normal.     ED Results / Procedures / Treatments   Labs (all labs ordered are listed, but only abnormal results are displayed) Labs Reviewed - No data to display  EKG None  Radiology DG Lumbar Spine Complete  Result Date: 01/03/2022 CLINICAL DATA:  Low back pain radiating down the left leg for 5 days. No injury. EXAM: LUMBAR SPINE - COMPLETE 4+ VIEW COMPARISON:  None Available. FINDINGS: No fracture, bone lesion or spondylolisthesis. Mild loss of  disc height with small endplate spurs at I5-O2. Remaining lumbar disc spaces are well preserved. Facet joints are well preserved. Soft tissues are unremarkable. IMPRESSION: 1. No fracture or acute finding.  No malalignment. 2. Mild disc degenerative changes at L1-L2. Electronically Signed   By: Amie Portland M.D.   On: 01/03/2022 16:21    Procedures Procedures    Medications Ordered in ED Medications  methylPREDNISolone sodium succinate (SOLU-MEDROL) 125 mg/2 mL injection 125 mg (has no administration in time range)  HYDROmorphone (DILAUDID) injection 1 mg (1 mg Intramuscular Given 01/03/22 1611)    ED Course/ Medical Decision Making/ A&P                           Medical Decision Making Amount and/or Complexity of Data Reviewed Radiology: ordered.  Risk Prescription drug management.  This patient presents to the ED for concern of back pain, this involves an extensive number of treatment options, and is a complaint that carries with it a high risk of complications and morbidity.  The differential diagnosis includes disc disease, tumor in the back   Co morbidities that complicate the patient evaluation  Obesity   Additional history obtained:  Additional history obtained from the patient External records from outside source obtained and reviewed including full records   Lab Tests:  No labs  Imaging Studies ordered:  I ordered imaging studies including lumbar spine series I independently visualized and interpreted imaging which showed degenerative disc disease at L1-L2 I agree with the radiologist interpretation   Cardiac Monitoring: / EKG:  The patient was maintained on a cardiac monitor.  I personally viewed and interpreted the cardiac monitored which showed an underlying rhythm of: Normal sinus rhythm   Consultations Obtained: No consultant Problem List / ED Course / Critical interventions / Medication management  Obesity and back pain I ordered medication  including Dilaudid for pain Reevaluation of the patient after these medicines showed that the patient improved I have reviewed the patients home medicines and have made adjustments as needed   Social Determinants of Health:  None   Test / Admission - Considered: Patient does not need admission but may need MRI as an outpatient  Patient with low back pain and sciatica down left leg.  She is put on prednisone and given Percocets and will follow-up with her doctor Tuesday as planned        Final Clinical Impression(s) / ED Diagnoses Final diagnoses:  Sciatica of left side    Rx / DC Orders ED Discharge Orders          Ordered    predniSONE (DELTASONE) 10 MG tablet  Daily        01/03/22 1635    oxyCODONE-acetaminophen (PERCOCET) 5-325 MG tablet  Every  4 hours PRN        01/03/22 1635              Bethann Berkshire, MD 01/08/22 1216

## 2022-01-03 NOTE — ED Provider Triage Note (Signed)
Emergency Medicine Provider Triage Evaluation Note  Janice Mccoy , a 51 y.o. female  was evaluated in triage.  Pt complains of left low back pain with radiculopathy to the left leg.  Denies fever, chills, or other neurological deficits.   Review of Systems  Positive: As above Negative: As above  Physical Exam  BP (!) 184/94 (BP Location: Right Arm)   Pulse 70   Temp 98.1 F (36.7 C) (Oral)   Resp 16   SpO2 98%  Gen:   Awake, no distress   Resp:  Normal effort  MSK:   Moves extremities without difficulty  Other:  Able to ambulate.  5/5 strength.  Left lower lumbar paraspinal muscle tenderness palpation present.  Without spinal process tenderness.  Medical Decision Making  Medically screening exam initiated at 2:26 PM.  Appropriate orders placed.  Janice Mccoy was informed that the remainder of the evaluation will be completed by another provider, this initial triage assessment does not replace that evaluation, and the importance of remaining in the ED until their evaluation is complete.     Evlyn Courier, PA-C 01/03/22 1427

## 2022-01-03 NOTE — ED Triage Notes (Signed)
Pt c/o L hip pain radiating down into her lower leg since Wednesday. Pt denies any known trauma/falls.

## 2022-01-03 NOTE — Discharge Instructions (Addendum)
Do not do any lifting over 5 pounds for a week.  Follow-up with your doctor as planned this week.

## 2022-06-28 IMAGING — MG MM DIGITAL DIAGNOSTIC UNILAT*L* W/ TOMO W/ CAD
8 series · 8 of 24 positions shown · non-contrast
Comparison: Previous exam(s).

CLINICAL DATA: 51-year-old female presenting for six-month
follow-up after a benign left breast biopsy.

EXAM:
DIGITAL DIAGNOSTIC UNILATERAL LEFT MAMMOGRAM WITH TOMOSYNTHESIS AND
CAD; ULTRASOUND LEFT BREAST LIMITED
TECHNIQUE: Left digital diagnostic mammography and breast tomosynthesis was
performed. The images were evaluated with computer-aided detection.;
Targeted ultrasound examination of the left breast was performed.

[L MLO synth-2D (1 of 2)]
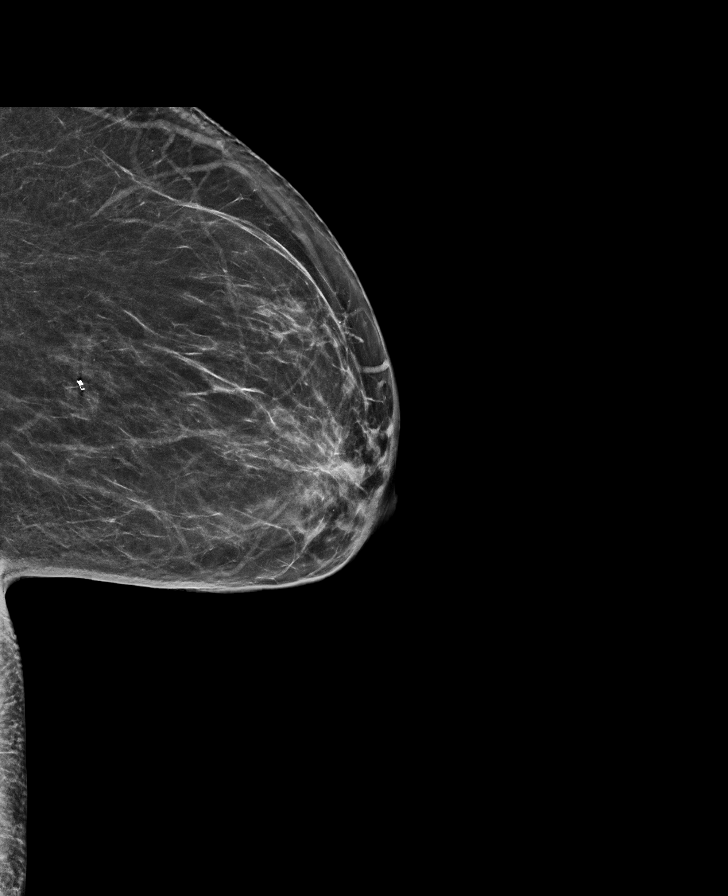

[L MLO synth-2D (2 of 2)]
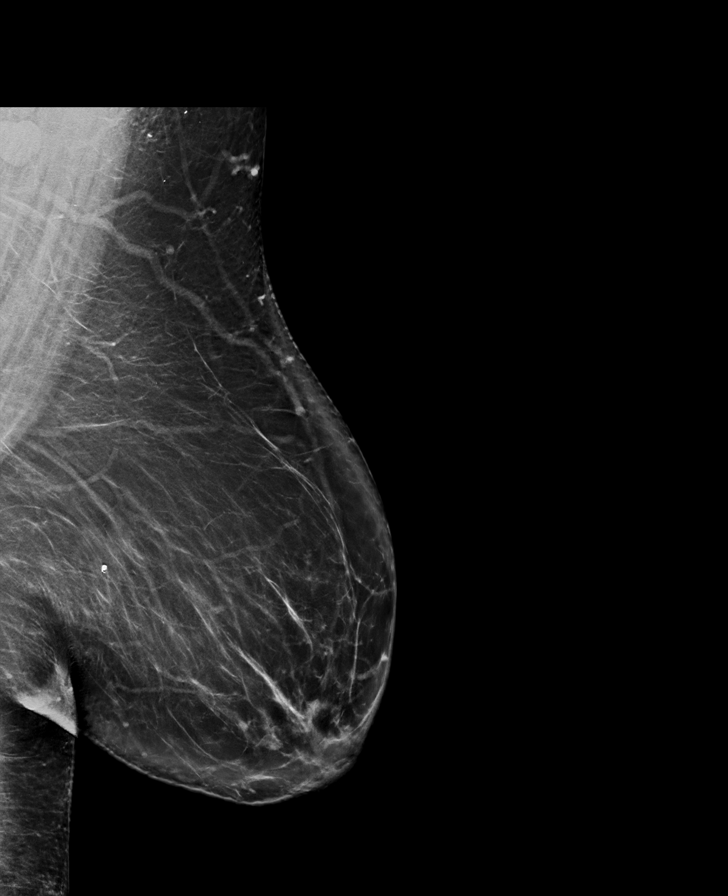

[L CC synth-2D (1 of 2)]
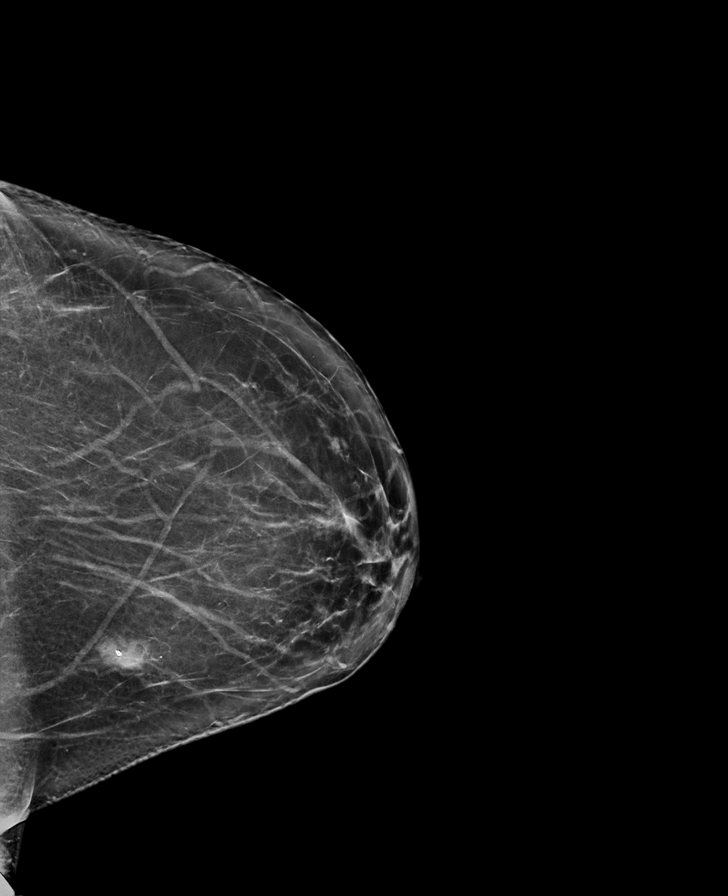

[L CC synth-2D (2 of 2)]
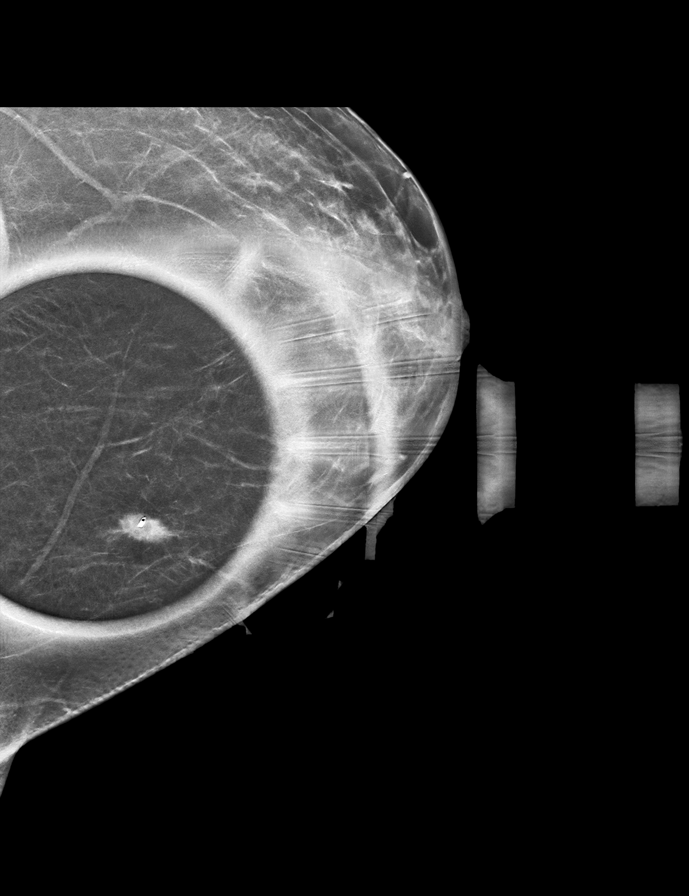

[L MLO tomo (1 of 2) · tomo slice 47/92.0]
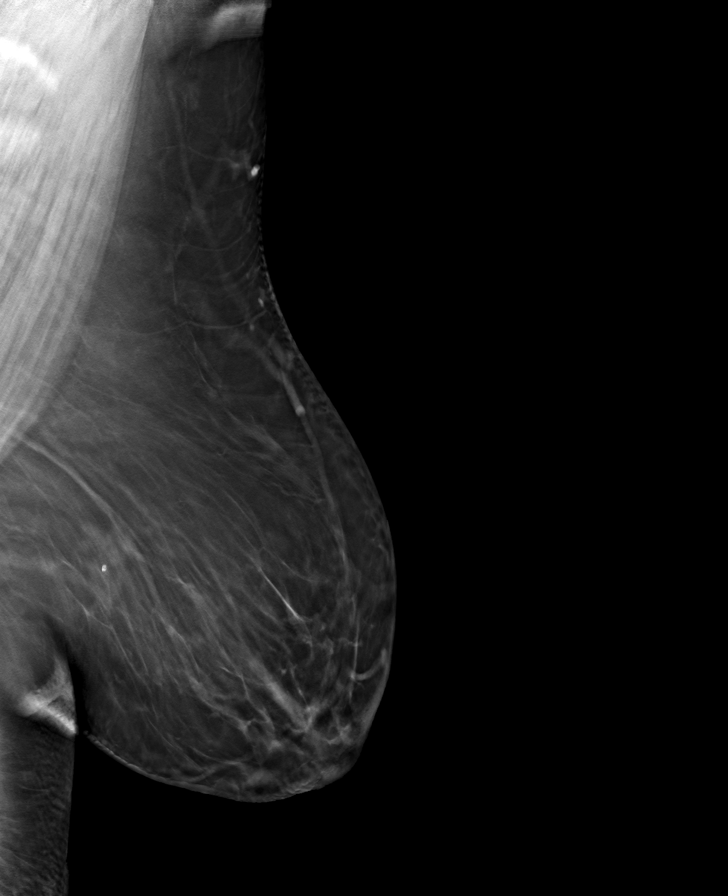

[L MLO tomo (2 of 2) · tomo slice 34/67.0]
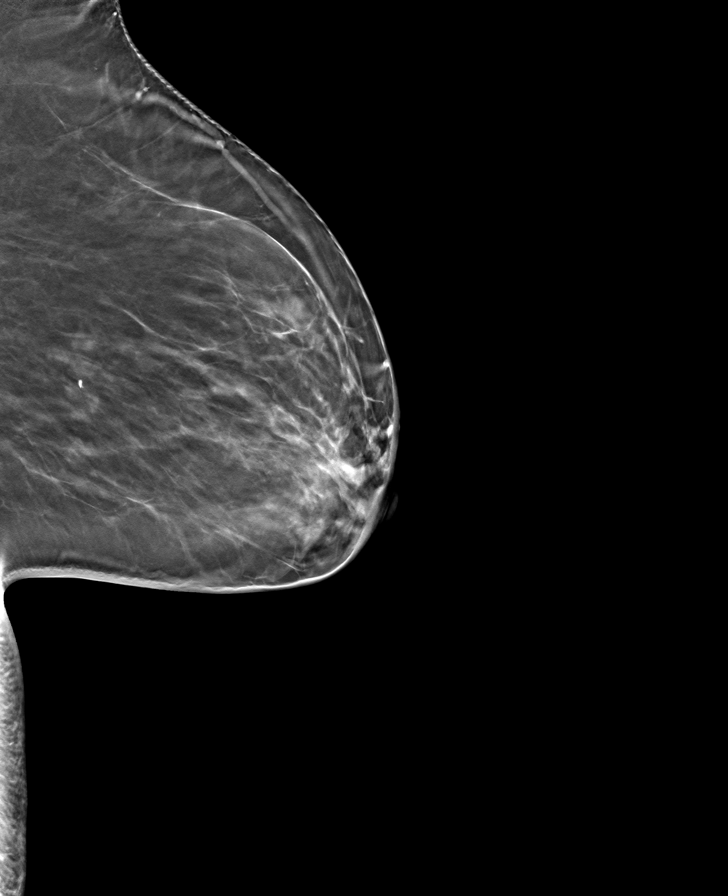

[L CC tomo (1 of 2) · tomo slice 27/54.0]
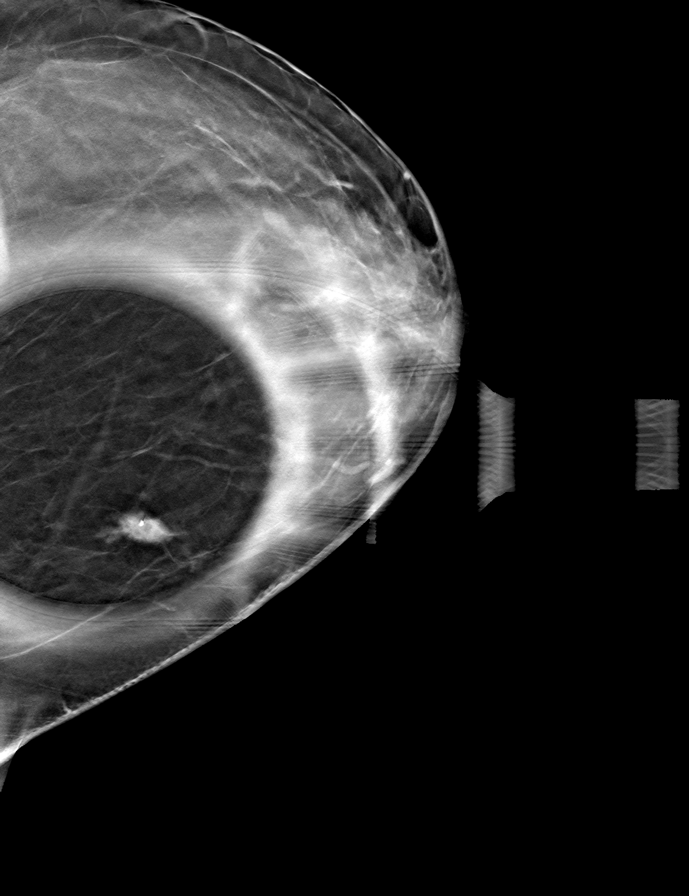

[L CC tomo (2 of 2) · tomo slice 37/74.0]
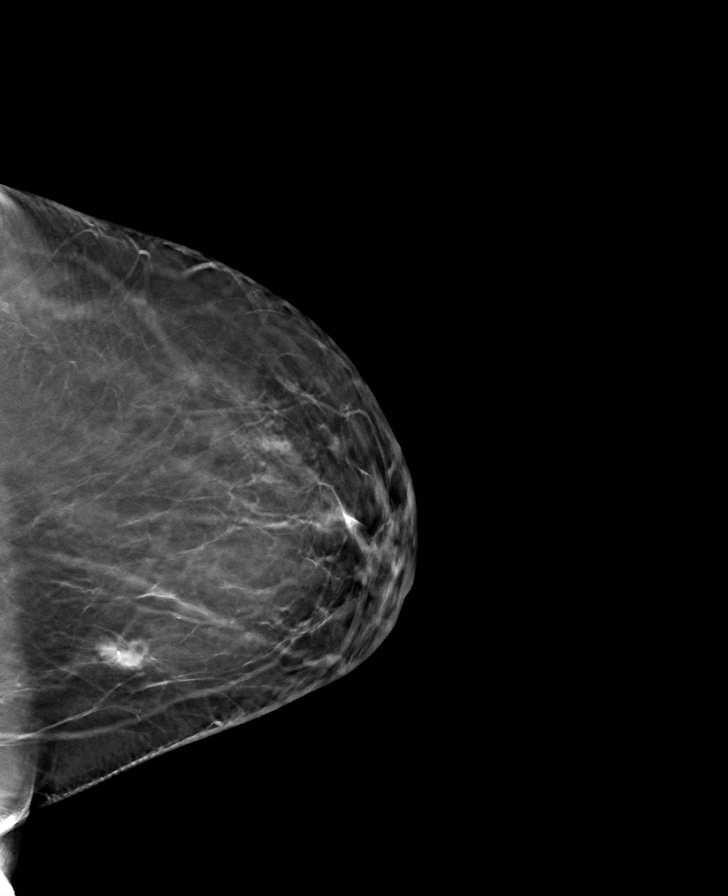

[8 of 24 positions shown; findings below may reference images not displayed]

ACR Breast Density Category b: There are scattered areas of
fibroglandular density.
FINDINGS: Post biopsy changes demonstrated in the lateral left breast at
posterior depth. Increasing density at the biopsy site on the cc
projection is most suggestive of post biopsy scar tissue.
Precautionary ultrasound was performed. No additional suspicious
findings in the remainder of the left breast.

Targeted ultrasound is performed, showing the clip at the patient's
previous biopsy site without surrounding mass or asymmetry.
IMPRESSION: Probably benign post biopsy changes in the medial left breast.
Recommend continued short-term follow-up.

RECOMMENDATION:
Bilateral diagnostic mammogram and possible ultrasound in 6 months.

I have discussed the findings and recommendations with the patient.
If applicable, a reminder letter will be sent to the patient
regarding the next appointment.

BI-RADS CATEGORY  3: Probably benign.

## 2022-10-08 ENCOUNTER — Other Ambulatory Visit: Payer: Self-pay | Admitting: Internal Medicine

## 2022-11-03 ENCOUNTER — Other Ambulatory Visit: Payer: Self-pay | Admitting: Obstetrics and Gynecology

## 2022-11-03 ENCOUNTER — Encounter: Payer: Self-pay | Admitting: Obstetrics and Gynecology

## 2022-11-03 DIAGNOSIS — N63 Unspecified lump in unspecified breast: Secondary | ICD-10-CM

## 2022-12-27 ENCOUNTER — Ambulatory Visit
Admission: RE | Admit: 2022-12-27 | Discharge: 2022-12-27 | Disposition: A | Discharge status: Home / Self Care | Payer: BC Managed Care – PPO | Source: Ambulatory Visit | Attending: Obstetrics and Gynecology | Admitting: Obstetrics and Gynecology

## 2022-12-27 DIAGNOSIS — N63 Unspecified lump in unspecified breast: Secondary | ICD-10-CM

## 2022-12-30 ENCOUNTER — Encounter: Payer: Self-pay | Admitting: Internal Medicine

## 2022-12-30 ENCOUNTER — Ambulatory Visit: Payer: BC Managed Care – PPO | Admitting: Internal Medicine

## 2022-12-30 VITALS — BP 130/86 | HR 60 | Ht 61.0 in | Wt 170.4 lb

## 2022-12-30 DIAGNOSIS — E890 Postprocedural hypothyroidism: Secondary | ICD-10-CM

## 2022-12-30 DIAGNOSIS — Z23 Encounter for immunization: Secondary | ICD-10-CM | POA: Diagnosis not present

## 2022-12-30 NOTE — Progress Notes (Signed)
Patient ID: Janice Mccoy, female   DOB: [birth year], 52 y.o.   MRN: [id number]  HPI  Janice Mccoy is a 52 y.o.-year-old female, returning for f/u for postablative hypothyroidism, after RAI tx for Graves ds. Last visit 1 year months ago.  Interim history: She continues to be very active at work and she is also walking and staying active at work.  She lost 26 pounds before last visit and 7 more since then.  She takes LT4 when she gets home in am.  Her migraines are getting worse and associated with diarrhea/vomiting. Started Imitrex - helping. She has allergies.  She has to move - she feels very stressed.  Reviewed  history: Pt started to have abnormal involuntary movements (akathisia?) around Easter 2015 >> she was started on Ativan >> no effect >> Propranolol 40 mg 2x a day >> this helped with the rapid heartbeat, tremors and migraines >> PCP checked TFTs >> thyrotoxicosis >> started on MMI 10 mg 3x a day >>  a repeat set of TFTs was improved >> she was advised to stop MMI on 11/19/2013 in preparation for an Uptake and Scan. She stopped the Inderal around the same time.   She had an Uptake and Scan on 11/29/2013, showing Graves ds, with uniform scan and an elevated uptake (50%).  She had RAI tx on 01/04/2014.   She developed postablative hypothyroidism >> started LT4 50 mcg >> 75 mcg daily >> dose increased to 88 mcg daily.  She was previously on selenium, then stopped.  Pt is on levothyroxine 88 mcg daily, taken: - in am - fasting - at least 30 min from b'fast - no Ca - no MVI  - no Fe - no PPIs - not on Biotin She is on vitamin C.  Reviewed her TFTs: Lab Results  Component Value Date   TSH 1.99 12/28/2021   TSH 4.00 09/18/2020   TSH 1.87 09/14/2019   TSH 1.55 09/12/2018   TSH 0.86 09/21/2017   TSH 2.04 01/04/2017   TSH 1.99 05/31/2016   TSH 4.68 (H) 04/08/2016   TSH 3.28 01/06/2016   TSH 1.59 01/06/2015   FREET4 0.94 12/28/2021   FREET4 1.06 09/18/2020    FREET4 1.2 09/14/2019   FREET4 0.95 09/12/2018   FREET4 1.11 09/21/2017   FREET4 0.97 01/04/2017   FREET4 0.91 05/31/2016   FREET4 0.75 04/08/2016   FREET4 0.72 01/06/2016   FREET4 0.98 01/06/2015  11/14/2013: TSH 1.4, fT4 0.4, TPO Abs 253, ATA 41.2  TSI's normalized: Lab Results  Component Value Date   TSI <89 01/04/2017   TSI 364 (H) 12/13/2013   Also, her TPO and ATA antibodies were elevated: Component     Latest Ref Rng & Units 01/04/2017  Thyroperoxidase Ab SerPl-aCnc     <9 IU/mL 28 (H)  Thyroglobulin Ab     < or = 1 IU/mL 8 (H)   Pt denies: - feeling nodules in neck - hoarseness - dysphagia - choking  No FH of thyroid cancer. No h/o radiation tx to head or neck. No herbal supplements. No Biotin use. No recent steroids use.   ROS: + see HPI  I reviewed pt's medications, allergies, PMH, social hx, family hx, and changes were documented in the history of present illness. Otherwise, unchanged from my initial visit note.  Past Medical History:  Diagnosis Date   Epilepsy (HCC)    childhood   Heart murmur    Past Surgical History:  Procedure Laterality Date  BREAST BIOPSY Left 11/2020   CYSTECTOMY     Social History   Socioeconomic History   Marital status: Single    Spouse name: Not on file   Number of children: 0   Years of education: 12   Highest education level: Not on file  Occupational History   Occupation: live in caregiver  Tobacco Use   Smoking status: Never   Smokeless tobacco: Never  Substance and Sexual Activity   Alcohol use: No   Drug use: No   Sexual activity: Not on file  Other Topics Concern   Not on file  Social History Narrative   Single, no children   Right handed    12 th grade   1/2-1 cup daily   Social Determinants of Health   Financial Resource Strain: Not on file  Food Insecurity: Not on file  Transportation Needs: Not on file  Physical Activity: Not on file  Stress: Not on file  Social Connections: Not on file   Intimate Partner Violence: Not on file   Current Outpatient Medications on File Prior to Visit  Medication Sig Dispense Refill   estradiol (ESTRACE) 0.5 MG tablet Take 0.5 mg by mouth daily.     levofloxacin (LEVAQUIN) 750 MG tablet  (Patient not taking: Reported on 09/18/2020)  0   levothyroxine (SYNTHROID) 88 MCG tablet TAKE 1 TABLET BY MOUTH DAILY 90 tablet 0   medroxyPROGESTERone (PROVERA) 2.5 MG tablet 2.5 mg.     oxyCODONE-acetaminophen (PERCOCET) 5-325 MG tablet Take 1 tablet by mouth every 4 (four) hours as needed. 20 tablet 0   predniSONE (DELTASONE) 10 MG tablet Take 2 tablets (20 mg total) by mouth daily. 14 tablet 0   No current facility-administered medications on file prior to visit.   Allergies  Allergen Reactions   Cefdinir    Amoxicillin Rash   No family history on file.  PE: BP 130/86   Pulse 60   Ht 5\' 1"  (1.549 m)   Wt 170 lb 6.4 oz (77.3 kg)   LMP 03/22/2016 (Exact Date)   SpO2 96%   BMI 32.20 kg/m    Wt Readings from Last 3 Encounters:  12/30/22 170 lb 6.4 oz (77.3 kg)  12/28/21 177 lb 9.6 oz (80.6 kg)  09/18/20 203 lb 9.6 oz (92.4 kg)   Constitutional: overweight, in NAD Eyes: EOMI, no exophthalmos ENT: no thyromegaly, no cervical lymphadenopathy Cardiovascular: RRR, No MRG Respiratory: CTA B Musculoskeletal: no deformities Skin: no rashes Neurological: + Very mild tremor with outstretched hands  ASSESSMENT: 1. Postablative Hypothyroidism  PLAN:  1.  - Patient with history of thyrotoxicosis found during investigation for involuntary movements (akathisia?).  She had clear thyrotoxic symptoms at that time: Weight loss, heat intolerance, palpitations, tachycardia, hyperreflexia.  A thyroid uptake and scan was consistent with Graves' disease.  She was initially on methimazole and beta-blocker but eventually we had to do RAI ablation in 12/2013, after which the involuntary movements gradually decreased with only occasional body tremors now.  She  still continues to have some tremors.  For this reason, we checked TPO and ATA antibodies in 2018 and these were elevated.  TSI antibodies were initially elevated and then they decreased to the normal range.  Her tremors are now only evident when she is anxious. -Developed post ablative hypothyroidism and is currently on levothyroxine. - latest thyroid labs reviewed with pt. >> normal: Lab Results  Component Value Date   TSH 1.99 12/28/2021  - she continues on LT4 88 mcg  daily - pt feels good on this dose.  Before last visit, she lost a significant amount of weight, 26 pounds, due to being active at work. She lost 7 more lbs since then. She does have more migraines lately, possible 2/2 allergies and stress. Imitrex helps. She mentions she may have vomited some LT4 tablets. - we discussed about taking the thyroid hormone every day, with water, >30 minutes before breakfast, separated by >4 hours from acid reflux medications, calcium, iron, multivitamins. Pt. is taking it correctly. - will check thyroid tests today: TSH and fT4 - If labs are abnormal, she will need to return for repeat TFTs in 1.5 months - OTW, I will see her back in a year.  She needs refills.  + flu shot today  Component     Latest Ref Rng 12/30/2022  TSH     0.35 - 5.50 uIU/mL 3.20   T4,Free(Direct)     0.60 - 1.60 ng/dL 1.61   TFTs are normal.  Will refill the same dose of levothyroxine.  Carlus Pavlov, MD PhD [facility] Endocrinology

## 2022-12-30 NOTE — Patient Instructions (Signed)
Please stop at the lab.  Please  continue Levothyroxine 88 mcg daily.  Take the thyroid hormone every day, with water, at least 30 minutes before breakfast, separated by at least 4 hours from: - acid reflux medications - calcium - iron - multivitamins  Please return in 1 year.

## 2022-12-31 LAB — TSH: TSH: 3.2 u[IU]/mL (ref 0.35–5.50)

## 2022-12-31 LAB — T4, FREE: Free T4: 1.08 ng/dL (ref 0.60–1.60)

## 2022-12-31 MED ORDER — LEVOTHYROXINE SODIUM 88 MCG PO TABS: 88.0000 ug | ORAL_TABLET | Freq: Every day | ORAL | 3 refills | Status: DC

## 2023-07-04 ENCOUNTER — Encounter: Payer: Self-pay | Admitting: Internal Medicine

## 2023-07-04 ENCOUNTER — Ambulatory Visit (INDEPENDENT_AMBULATORY_CARE_PROVIDER_SITE_OTHER): Payer: Self-pay | Admitting: Internal Medicine

## 2023-07-04 VITALS — BP 138/80 | HR 70 | Ht 61.0 in | Wt 165.4 lb

## 2023-07-04 DIAGNOSIS — E890 Postprocedural hypothyroidism: Secondary | ICD-10-CM

## 2023-07-04 NOTE — Patient Instructions (Signed)
Please stop at the lab.  Please  continue Levothyroxine 88 mcg daily.  Take the thyroid hormone every day, with water, at least 30 minutes before breakfast, separated by at least 4 hours from: - acid reflux medications - calcium - iron - multivitamins  Please return in 1 year.

## 2023-07-04 NOTE — Progress Notes (Signed)
 Patient ID: Janice Mccoy, female   DOB: 01-04-1971, 53 y.o.   MRN: [id number]  HPI  Janice Mccoy is a 53 y.o.-year-old female, returning for f/u for postablative hypothyroidism, after RAI tx for Graves ds. Last visit 1 year months ago.  Interim history: She continues to be very active at work and she is also walking and staying active at work (she works outside, night shift).  She lost 33 pounds in the 2 years prior to our last visit.  She lost 5 more lbs since last OV. She takes LT4 when she gets home in am.  At last visit she described worse migraines associated with vomiting and diarrhea.  She started Imitrex and this was helping. She still has sinus HAs.  Reviewed  history: Pt started to have abnormal involuntary movements (akathisia?) around Easter 2015 >> she was started on Ativan >> no effect >> Propranolol 40 mg 2x a day >> this helped with the rapid heartbeat, tremors and migraines >> PCP checked TFTs >> thyrotoxicosis >> started on MMI 10 mg 3x a day >>  a repeat set of TFTs was improved >> she was advised to stop MMI on 11/19/2013 in preparation for an Uptake and Scan. She stopped the Inderal around the same time.   She had an Uptake and Scan on 11/29/2013, showing Graves ds, with uniform scan and an elevated uptake (50%).  She had RAI tx on 01/04/2014.   She developed postablative hypothyroidism >> started LT4 50 mcg >> 75 mcg daily >> dose increased to 88 mcg daily.  She was previously on selenium, then stopped.  Pt is on levothyroxine 88 mcg daily, taken: - at 2 pm, when wakes up - fasting - at least 30 min from b'fast - no Ca - no MVI  - no Fe - no PPIs - not on Biotin  Reviewed her TFTs: Lab Results  Component Value Date   TSH 3.20 12/30/2022   TSH 1.99 12/28/2021   TSH 4.00 09/18/2020   TSH 1.87 09/14/2019   TSH 1.55 09/12/2018   TSH 0.86 09/21/2017   TSH 2.04 01/04/2017   TSH 1.99 05/31/2016   TSH 4.68 (H) 04/08/2016   TSH 3.28 01/06/2016    FREET4 1.08 12/30/2022   FREET4 0.94 12/28/2021   FREET4 1.06 09/18/2020   FREET4 1.2 09/14/2019   FREET4 0.95 09/12/2018   FREET4 1.11 09/21/2017   FREET4 0.97 01/04/2017   FREET4 0.91 05/31/2016   FREET4 0.75 04/08/2016   FREET4 0.72 01/06/2016  11/14/2013: TSH 1.4, fT4 0.4, TPO Abs 253, ATA 41.2  TSI's normalized: Lab Results  Component Value Date   TSI <89 01/04/2017   TSI 364 (H) 12/13/2013   Also, her TPO and ATA antibodies were elevated: Component     Latest Ref Rng & Units 01/04/2017  Thyroperoxidase Ab SerPl-aCnc     <9 IU/mL 28 (H)  Thyroglobulin Ab     < or = 1 IU/mL 8 (H)   Pt denies: - feeling nodules in neck - hoarseness - dysphagia - choking  No FH of thyroid cancer. No h/o radiation tx to head or neck. No herbal supplements. No Biotin use. No recent steroids use.   ROS: + see HPI  I reviewed pt's medications, allergies, PMH, social hx, family hx, and changes were documented in the history of present illness. Otherwise, unchanged from my initial visit note.  Past Medical History:  Diagnosis Date   Epilepsy (HCC)    childhood   Heart murmur  Past Surgical History:  Procedure Laterality Date   BREAST BIOPSY Left 11/2020   CYSTECTOMY     Social History   Socioeconomic History   Marital status: Single    Spouse name: Not on file   Number of children: 0   Years of education: 12   Highest education level: Not on file  Occupational History   Occupation: live in caregiver  Tobacco Use   Smoking status: Never   Smokeless tobacco: Never  Substance and Sexual Activity   Alcohol use: No   Drug use: No   Sexual activity: Not on file  Other Topics Concern   Not on file  Social History Narrative   Single, no children   Right handed    12 th grade   1/2-1 cup daily   Social Drivers of Corporate investment banker Strain: Not on file  Food Insecurity: Not on file  Transportation Needs: Not on file  Physical Activity: Not on file  Stress:  Not on file  Social Connections: Not on file  Intimate Partner Violence: Not on file   Current Outpatient Medications on File Prior to Visit  Medication Sig Dispense Refill   estradiol (ESTRACE) 0.5 MG tablet Take 0.5 mg by mouth daily.     levofloxacin (LEVAQUIN) 750 MG tablet   0   levothyroxine (SYNTHROID) 88 MCG tablet Take 1 tablet (88 mcg total) by mouth daily. 90 tablet 3   medroxyPROGESTERone (PROVERA) 2.5 MG tablet 2.5 mg.     oxyCODONE-acetaminophen (PERCOCET) 5-325 MG tablet Take 1 tablet by mouth every 4 (four) hours as needed. 20 tablet 0   predniSONE (DELTASONE) 10 MG tablet Take 2 tablets (20 mg total) by mouth daily. 14 tablet 0   No current facility-administered medications on file prior to visit.   Allergies  Allergen Reactions   Cefdinir    Amoxicillin Rash   No family history on file.  PE: BP 138/80   Pulse 70   Ht 5\' 1"  (1.549 m)   Wt 165 lb 6.4 oz (75 kg)   LMP 03/22/2016 (Exact Date)   SpO2 97%   BMI 31.25 kg/m    Wt Readings from Last 3 Encounters:  07/04/23 165 lb 6.4 oz (75 kg)  12/30/22 170 lb 6.4 oz (77.3 kg)  12/28/21 177 lb 9.6 oz (80.6 kg)   Constitutional: overweight, in NAD Eyes: EOMI, no exophthalmos ENT: no thyromegaly, no cervical lymphadenopathy Cardiovascular: RRR, No MRG Respiratory: CTA B Musculoskeletal: no deformities Skin: no rashes Neurological: no tremor with outstretched hands  ASSESSMENT: 1. Postablative Hypothyroidism  PLAN:  1.  - Patient with history of thyrotoxicosis found during investigation for involuntary movements (akathisia?).  She had clear thyrotoxic symptoms at that time: Weight loss, heat intolerance, palpitations, tachycardia, hyperreflexia.  Thyroid uptake and scan was consistent with Graves' disease.  She was initially on methimazole and beta-blocker but eventually we had to do RAI ablation in 12/2013, after which the involuntary movements gradually decreased with only occasional body tremors now.  Due  to the tremors, will check TPO and ATA antibodies in 2018 and these were elevated.  TSI antibodies were initially elevated but then decreased to the normal range.  The tremors are now evident only when she is anxious. -She developed postablative hypothyroidism after the RAI treatment she is currently on levothyroxine - latest thyroid labs reviewed with pt. >> normal: Lab Results  Component Value Date   TSH 3.20 12/30/2022  - she continues on LT4 88 mcg daily -  pt feels good on this dose.  In 2023, she lost a significant amount of weight, 26 pounds due to being active at work.  Before last visit she lost 7 more pounds.  Since then, she lost 5 more. - we discussed about taking the thyroid hormone every day, with water, >30 minutes before breakfast, separated by >4 hours from acid reflux medications, calcium, iron, multivitamins. Pt. is taking it correctly. - will check thyroid tests today: TSH and fT4 - If labs are abnormal, she will need to return for repeat TFTs in 1.5 months - OTW, I will see her back in a year  She needs refills for the next year if we continue the same dose.  Orders Placed This Encounter  Procedures   TSH   T4, free   Carlus Pavlov, MD PhD [facility] Endocrinology

## 2023-07-05 ENCOUNTER — Encounter: Payer: Self-pay | Admitting: Internal Medicine

## 2023-07-05 LAB — TSH: TSH: 6.71 m[IU]/L — ABNORMAL HIGH

## 2023-07-05 LAB — T4, FREE: Free T4: 1.2 ng/dL (ref 0.8–1.8)

## 2023-07-05 MED ORDER — LEVOTHYROXINE SODIUM 100 MCG PO TABS: 100.0000 ug | ORAL_TABLET | Freq: Every day | ORAL | 5 refills | Status: DC

## 2023-07-05 NOTE — Addendum Note (Signed)
 Addended by: Carlus Pavlov on: 07/05/2023 04:59 PM   Modules accepted: Orders

## 2023-08-26 ENCOUNTER — Other Ambulatory Visit: Payer: Self-pay

## 2023-08-27 LAB — T4, FREE: Free T4: 1.4 ng/dL (ref 0.8–1.8)

## 2023-08-27 LAB — TSH: TSH: 2.16 m[IU]/L

## 2024-04-03 ENCOUNTER — Other Ambulatory Visit: Payer: Self-pay | Admitting: Internal Medicine

## 2024-07-09 ENCOUNTER — Ambulatory Visit: Payer: Self-pay | Admitting: Internal Medicine
